# Patient Record
Sex: Female | Born: 1935 | Race: White | Hispanic: No | Smoking: Never smoker
Health system: Southern US, Community
[De-identification: ages and names within clinical notes are randomized; demographics above are authoritative.]

## PROBLEM LIST (undated history)

## (undated) DIAGNOSIS — C801 Malignant (primary) neoplasm, unspecified: Secondary | ICD-10-CM

## (undated) DIAGNOSIS — D649 Anemia, unspecified: Secondary | ICD-10-CM

## (undated) DIAGNOSIS — R51 Headache: Secondary | ICD-10-CM

## (undated) DIAGNOSIS — N189 Chronic kidney disease, unspecified: Secondary | ICD-10-CM

## (undated) DIAGNOSIS — M109 Gout, unspecified: Secondary | ICD-10-CM

## (undated) DIAGNOSIS — N841 Polyp of cervix uteri: Secondary | ICD-10-CM

## (undated) DIAGNOSIS — I872 Venous insufficiency (chronic) (peripheral): Secondary | ICD-10-CM

## (undated) DIAGNOSIS — Z8489 Family history of other specified conditions: Secondary | ICD-10-CM

## (undated) DIAGNOSIS — R42 Dizziness and giddiness: Secondary | ICD-10-CM

## (undated) DIAGNOSIS — I10 Essential (primary) hypertension: Secondary | ICD-10-CM

## (undated) DIAGNOSIS — N950 Postmenopausal bleeding: Secondary | ICD-10-CM

## (undated) DIAGNOSIS — M1990 Unspecified osteoarthritis, unspecified site: Secondary | ICD-10-CM

## (undated) DIAGNOSIS — R519 Headache, unspecified: Secondary | ICD-10-CM

## (undated) DIAGNOSIS — E7800 Pure hypercholesterolemia, unspecified: Secondary | ICD-10-CM

## (undated) HISTORY — PX: MOUTH SURGERY: SHX715

## (undated) HISTORY — PX: DILATION AND CURETTAGE OF UTERUS: SHX78

## (undated) HISTORY — PX: TUBAL LIGATION: SHX77

## (undated) HISTORY — PX: CHOLECYSTECTOMY: SHX55

## (undated) HISTORY — PX: OTHER SURGICAL HISTORY: SHX169

## (undated) HISTORY — PX: APPENDECTOMY: SHX54

## (undated) HISTORY — PX: JOINT REPLACEMENT: SHX530

---

## 2004-03-30 ENCOUNTER — Ambulatory Visit: Payer: Self-pay | Admitting: Gastroenterology

## 2005-01-25 ENCOUNTER — Ambulatory Visit: Payer: Self-pay | Admitting: Internal Medicine

## 2006-01-31 ENCOUNTER — Ambulatory Visit: Payer: Self-pay | Admitting: Internal Medicine

## 2007-02-22 ENCOUNTER — Ambulatory Visit: Payer: Self-pay | Admitting: Internal Medicine

## 2007-03-30 ENCOUNTER — Ambulatory Visit: Payer: Self-pay | Admitting: Internal Medicine

## 2007-04-11 ENCOUNTER — Ambulatory Visit: Payer: Self-pay | Admitting: Gastroenterology

## 2008-03-05 ENCOUNTER — Ambulatory Visit: Payer: Self-pay | Admitting: Internal Medicine

## 2009-04-21 ENCOUNTER — Ambulatory Visit: Payer: Self-pay | Admitting: Internal Medicine

## 2010-04-30 ENCOUNTER — Ambulatory Visit: Payer: Self-pay | Admitting: Internal Medicine

## 2010-05-11 ENCOUNTER — Ambulatory Visit: Payer: Self-pay | Admitting: Internal Medicine

## 2010-06-02 ENCOUNTER — Ambulatory Visit: Payer: Self-pay | Admitting: Surgery

## 2010-06-02 HISTORY — PX: BREAST BIOPSY: SHX20

## 2010-06-07 LAB — PATHOLOGY REPORT

## 2011-02-17 ENCOUNTER — Ambulatory Visit: Payer: Self-pay | Admitting: Gastroenterology

## 2011-02-21 LAB — PATHOLOGY REPORT

## 2011-04-07 ENCOUNTER — Ambulatory Visit: Payer: Self-pay | Admitting: Internal Medicine

## 2011-06-07 ENCOUNTER — Ambulatory Visit: Payer: Self-pay | Admitting: Internal Medicine

## 2011-09-18 ENCOUNTER — Observation Stay: Payer: Self-pay | Admitting: Internal Medicine

## 2011-09-20 LAB — CBC WITH DIFFERENTIAL/PLATELET
Basophil #: 0 10*3/uL (ref 0.0–0.1)
Eosinophil #: 0 10*3/uL (ref 0.0–0.7)
Eosinophil %: 0 %
HCT: 35.6 % (ref 35.0–47.0)
Lymphocyte %: 14.5 %
MCHC: 32.9 g/dL (ref 32.0–36.0)
MCV: 88 fL (ref 80–100)
Monocyte #: 0.5 x10 3/mm (ref 0.2–0.9)
Neutrophil #: 9 10*3/uL — ABNORMAL HIGH (ref 1.4–6.5)
Platelet: 278 10*3/uL (ref 150–440)
RBC: 4.04 10*6/uL (ref 3.80–5.20)
WBC: 11.2 10*3/uL — ABNORMAL HIGH (ref 3.6–11.0)

## 2011-09-20 LAB — COMPREHENSIVE METABOLIC PANEL
Albumin: 3.7 g/dL (ref 3.4–5.0)
Alkaline Phosphatase: 68 U/L (ref 50–136)
Anion Gap: 9 (ref 7–16)
Chloride: 104 mmol/L (ref 98–107)
Co2: 25 mmol/L (ref 21–32)
Creatinine: 1.68 mg/dL — ABNORMAL HIGH (ref 0.60–1.30)
EGFR (African American): 34 — ABNORMAL LOW
Glucose: 126 mg/dL — ABNORMAL HIGH (ref 65–99)
Osmolality: 288 (ref 275–301)
Potassium: 4.4 mmol/L (ref 3.5–5.1)
SGOT(AST): 61 U/L — ABNORMAL HIGH (ref 15–37)
Sodium: 138 mmol/L (ref 136–145)
Total Protein: 7 g/dL (ref 6.4–8.2)

## 2012-01-20 DIAGNOSIS — M170 Bilateral primary osteoarthritis of knee: Secondary | ICD-10-CM | POA: Insufficient documentation

## 2012-05-15 ENCOUNTER — Ambulatory Visit: Payer: Self-pay | Admitting: Orthopedic Surgery

## 2012-06-11 ENCOUNTER — Ambulatory Visit: Payer: Self-pay | Admitting: Orthopedic Surgery

## 2012-06-11 LAB — BASIC METABOLIC PANEL
Calcium, Total: 10 mg/dL (ref 8.5–10.1)
Co2: 29 mmol/L (ref 21–32)
Creatinine: 1.14 mg/dL (ref 0.60–1.30)
EGFR (African American): 54 — ABNORMAL LOW
EGFR (Non-African Amer.): 47 — ABNORMAL LOW
Osmolality: 281 (ref 275–301)

## 2012-06-11 LAB — CBC
HGB: 12.4 g/dL (ref 12.0–16.0)
MCHC: 32.8 g/dL (ref 32.0–36.0)
MCV: 89 fL (ref 80–100)
Platelet: 267 10*3/uL (ref 150–440)
RBC: 4.26 10*6/uL (ref 3.80–5.20)
RDW: 14.3 % (ref 11.5–14.5)

## 2012-06-11 LAB — SEDIMENTATION RATE: Erythrocyte Sed Rate: 34 mm/hr — ABNORMAL HIGH (ref 0–30)

## 2012-06-11 LAB — MRSA PCR SCREENING

## 2012-06-14 ENCOUNTER — Ambulatory Visit: Payer: Self-pay | Admitting: Anesthesiology

## 2012-06-19 ENCOUNTER — Ambulatory Visit: Payer: Self-pay | Admitting: Internal Medicine

## 2012-06-21 ENCOUNTER — Inpatient Hospital Stay: Payer: Self-pay | Admitting: Orthopedic Surgery

## 2012-06-22 ENCOUNTER — Encounter: Payer: Self-pay | Admitting: Internal Medicine

## 2012-06-22 LAB — BASIC METABOLIC PANEL
Anion Gap: 5 — ABNORMAL LOW (ref 7–16)
Calcium, Total: 8.7 mg/dL (ref 8.5–10.1)
Chloride: 107 mmol/L (ref 98–107)
Co2: 27 mmol/L (ref 21–32)
EGFR (African American): >60
EGFR (Non-African Amer.): 55 — ABNORMAL LOW
Glucose: 90 mg/dL (ref 65–99)
Osmolality: 277 (ref 275–301)
Sodium: 139 mmol/L (ref 136–145)

## 2012-06-22 LAB — HEMOGLOBIN: HGB: 9.9 g/dL — ABNORMAL LOW (ref 12.0–16.0)

## 2012-06-22 LAB — PLATELET COUNT: Platelet: 227 10*3/uL (ref 150–440)

## 2012-06-23 LAB — HEMOGLOBIN: HGB: 10.1 g/dL — ABNORMAL LOW (ref 12.0–16.0)

## 2012-06-30 ENCOUNTER — Encounter: Payer: Self-pay | Admitting: Internal Medicine

## 2013-05-21 DIAGNOSIS — M25562 Pain in left knee: Secondary | ICD-10-CM | POA: Insufficient documentation

## 2013-05-21 DIAGNOSIS — M1712 Unilateral primary osteoarthritis, left knee: Secondary | ICD-10-CM | POA: Insufficient documentation

## 2013-06-20 ENCOUNTER — Ambulatory Visit: Payer: Self-pay | Admitting: Internal Medicine

## 2014-01-28 ENCOUNTER — Ambulatory Visit: Payer: Self-pay | Admitting: Orthopedic Surgery

## 2014-03-13 ENCOUNTER — Ambulatory Visit: Payer: Self-pay | Admitting: Orthopedic Surgery

## 2014-03-13 LAB — BASIC METABOLIC PANEL
Anion Gap: 8 (ref 7–16)
BUN: 28 mg/dL — ABNORMAL HIGH (ref 7–18)
CALCIUM: 9.2 mg/dL (ref 8.5–10.1)
CO2: 28 mmol/L (ref 21–32)
CREATININE: 1.37 mg/dL — AB (ref 0.60–1.30)
Chloride: 104 mmol/L (ref 98–107)
EGFR (African American): 48 — ABNORMAL LOW
GFR CALC NON AF AMER: 40 — AB
Glucose: 111 mg/dL — ABNORMAL HIGH (ref 65–99)
Osmolality: 286 (ref 275–301)
Potassium: 3.8 mmol/L (ref 3.5–5.1)
Sodium: 140 mmol/L (ref 136–145)

## 2014-03-13 LAB — CBC
HCT: 36 % (ref 35.0–47.0)
HGB: 11.5 g/dL — AB (ref 12.0–16.0)
MCH: 29.5 pg (ref 26.0–34.0)
MCHC: 32.1 g/dL (ref 32.0–36.0)
MCV: 92 fL (ref 80–100)
Platelet: 240 10*3/uL (ref 150–440)
RBC: 3.92 10*6/uL (ref 3.80–5.20)
RDW: 14.4 % (ref 11.5–14.5)
WBC: 6.7 10*3/uL (ref 3.6–11.0)

## 2014-03-13 LAB — SEDIMENTATION RATE: ERYTHROCYTE SED RATE: 34 mm/h — AB (ref 0–30)

## 2014-03-13 LAB — URINALYSIS, COMPLETE
BILIRUBIN, UR: NEGATIVE
BLOOD: NEGATIVE
Bacteria: NONE SEEN
Glucose,UR: NEGATIVE mg/dL (ref 0–75)
KETONE: NEGATIVE
Leukocyte Esterase: NEGATIVE
Nitrite: NEGATIVE
PROTEIN: NEGATIVE
Ph: 6 (ref 4.5–8.0)
SPECIFIC GRAVITY: 1.014 (ref 1.003–1.030)
Squamous Epithelial: 1
WBC UR: <1 /HPF (ref 0–5)

## 2014-03-13 LAB — PROTIME-INR
INR: 0.9
Prothrombin Time: 12.3 secs (ref 11.5–14.7)

## 2014-03-13 LAB — APTT: Activated PTT: 29.2 secs (ref 23.6–35.9)

## 2014-03-13 LAB — MRSA PCR SCREENING

## 2014-03-25 ENCOUNTER — Inpatient Hospital Stay: Payer: Self-pay

## 2014-03-26 LAB — BASIC METABOLIC PANEL
Anion Gap: 6 — ABNORMAL LOW (ref 7–16)
BUN: 18 mg/dL (ref 7–18)
Calcium, Total: 8.3 mg/dL — ABNORMAL LOW (ref 8.5–10.1)
Chloride: 103 mmol/L (ref 98–107)
Co2: 30 mmol/L (ref 21–32)
Creatinine: 1.21 mg/dL (ref 0.60–1.30)
EGFR (African American): 56 — ABNORMAL LOW
EGFR (Non-African Amer.): 46 — ABNORMAL LOW
GLUCOSE: 130 mg/dL — AB (ref 65–99)
OSMOLALITY: 281 (ref 275–301)
Potassium: 3.6 mmol/L (ref 3.5–5.1)
Sodium: 139 mmol/L (ref 136–145)

## 2014-03-26 LAB — PLATELET COUNT: Platelet: 157 10*3/uL (ref 150–440)

## 2014-03-26 LAB — HEMOGLOBIN: HGB: 9.8 g/dL — ABNORMAL LOW (ref 12.0–16.0)

## 2014-03-27 LAB — CBC
HCT: 30 % — AB (ref 35.0–47.0)
HGB: 9.8 g/dL — ABNORMAL LOW (ref 12.0–16.0)
MCH: 30.3 pg (ref 26.0–34.0)
MCHC: 32.5 g/dL (ref 32.0–36.0)
MCV: 93 fL (ref 80–100)
Platelet: 152 10*3/uL (ref 150–440)
RBC: 3.22 10*6/uL — ABNORMAL LOW (ref 3.80–5.20)
RDW: 14.6 % — AB (ref 11.5–14.5)
WBC: 7.7 10*3/uL (ref 3.6–11.0)

## 2014-03-28 ENCOUNTER — Encounter: Payer: Self-pay | Admitting: Internal Medicine

## 2014-03-30 ENCOUNTER — Encounter: Payer: Self-pay | Admitting: Internal Medicine

## 2014-04-29 ENCOUNTER — Encounter: Payer: Self-pay | Admitting: Internal Medicine

## 2014-05-30 ENCOUNTER — Encounter: Payer: Self-pay | Admitting: Internal Medicine

## 2014-05-30 DIAGNOSIS — R42 Dizziness and giddiness: Secondary | ICD-10-CM

## 2014-05-30 HISTORY — DX: Dizziness and giddiness: R42

## 2014-07-10 ENCOUNTER — Ambulatory Visit: Payer: Self-pay | Admitting: Internal Medicine

## 2014-09-19 NOTE — Op Note (Signed)
PATIENT NAME:  Susan Brooks, Susan Brooks MR#:  [id number] DATE OF BIRTH:  1935/01/05  DATE OF PROCEDURE:  06/21/2012  PREOPERATIVE DIAGNOSIS: Severe right knee osteoarthritis.   POSTOPERATIVE DIAGNOSIS: Severe right knee osteoarthritis.   PROCEDURE: Right total knee replacement.   ANESTHESIA: Spinal.   SURGEON: Laurene Footman, M.D.  DESCRIPTION OF PROCEDURE: The patient was brought to the Operating Room and after adequate anesthesia was obtained, the right leg was prepped and draped in the usual sterile fashion with a tourniquet applied to the upper thigh An Alvarado legholder was utilized during the procedure. After patient identification and timeout procedures were completed, the leg was exsanguinated with an Esmarch and a midline skin incision was made followed by a medial parapatellar arthrotomy. Inspection revealed sclerotic bone throughout the knee, essentially no cartilage remaining, and extensive osteophyte formation. The ACL was absent and the fat pad was excised. Proximal tibia was exposed to allow for application of the tibial cutting block guide for the proximal resection. After checking alignment and getting this in the appropriate position, proximal tibia cut was carried out. Next, the femur was approached in a similar fashion, with distal femoral resection cut made with cuts matching the femoral block. The 4 cutting block was then applied. Anterior, posterior and chamfer cuts were carried out without notching. The residual horns of the meniscus were excised, along with a posterior osteophyte and loose body off the posterior capsule. At this point, trial components were placed, with the rotation on the tibial side being based on a pin left from the insertion of the initial cutting block. Proximal drilling was carried out, followed by the notch cut and trialing with a 4 femur in place. The 12 mm insert gave excellent stability in flexion, extension and mid flexion, with full extension being obtained.  At this point, the patella was cut with freehand technique, since it had been so worn, sized to a size 2 and drill holes made. There was excellent patellofemoral tracking with no-touch technique. At this point, all components were removed and the bony surfaces thoroughly irrigated and dried. A combination of morphine, Sensorcaine and Toradol along with saline was infiltrated in the posterior capsule as well as along the medial arthrotomy for postoperative analgesia. The tibial component was cemented into place first with excess cement removed, followed by the tibial insert. The femoral component was placed and the knee held in extension as the patellar button was clamped into place. After the cement had set, excess bone cement was removed and the knee again thoroughly irrigated. The tourniquet was let down to check hemostasis. The arthrotomy was repaired using a heavy quill suture followed by 2-0 quill subcutaneously, skin staples, Xeroform, 4 x 4's, Webril, ABD, Ace wrap and Polar Care.   ESTIMATED BLOOD LOSS: Less than 50 mL.   COMPLICATIONS: None.   SPECIMEN: Cut ends of bone.   IMPLANTS: Medacta GMK primary total knee system, size 2 patella, size 3 right fixed tibial plate; a 3, 12 mm UC tibial insert with a 4 narrow right standard femur, all components cemented.    ____________________________ Laurene Footman, MD mjm:jm D: 06/21/2012 18:41:43 ET T: 06/21/2012 21:40:58 ET JOB#: [id number]  cc: Laurene Footman, MD, <Dictator> Laurene Footman MD ELECTRONICALLY SIGNED 06/22/2012 10:46

## 2014-09-20 NOTE — Op Note (Signed)
PATIENT NAME:  Susan Brooks, Susan Brooks MR#:  [id number] DATE OF BIRTH:  1935-04-23  DATE OF PROCEDURE:  03/25/2014  PREOPERATIVE DIAGNOSIS: Severe left knee osteoarthritis.   POSTOPERATIVE DIAGNOSIS: Severe left knee osteoarthritis.   PROCEDURE: Left total knee replacement.   ANESTHESIA: Spinal.   SURGEON: Hessie Knows, M.D.   ASSISTANT: Rachelle Hora, PA-C.   DESCRIPTION OF PROCEDURE: The patient was brought to the operating room and after adequate anesthesia was obtained, the left leg was prepped and draped in the usual sterile fashion with a bump underneath the left buttock to internally rotate the leg. A tourniquet applied to the upper thigh. After prepping and draping usual sterile fashion, appropriate patient identification and timeout procedures were completed, the tourniquet was raised. A midline skin incision was made, followed by a medial parapatellar arthrotomy. Inspection revealed eburnated bone in the medial compartment and patellofemoral joint was significant bone loss in each, particularly of the patella and femoral condyle. The lateral compartment had exposed bone, but without loss of bone. The ACL and PCL were excised along with the infrapatellar fat pad and anterior horns of the menisci. The proximal tibia was exposed to allow for application of the Medacta cutting block. This was applied and the proximal tibia cut carried out after the femoral cut and then carried out as well with redone with an additional millimeters resected to get the appropriate  extension gap Attention was turned to the femur the cutting block applied after removing the small amount of residual cartilage and the distal cut carried out with cuts matching they preop template. A 3 cutting guide was applied, anterior, posterior and chamfer cuts made. At this point, the residual horns of the posterior horns of the menisci were excised and a curved osteotome used to remove posterior osteophytes on the femur. The tibial tray  was placed in the appropriate orientation and proximal drill hole made and keel punch followed by placement of a 10 mm insert, trial. The femoral component trial was impacted into place and had good stability with normal patellofemoral tracking. Distal femoral drill holes were then made and all trials were removed. The notch cut for the femur was then carried out. Attention was turned to the patella with patellar cutting guide applied. The patella was quite thin with loss of bone and so less than 10 mm resected, appropriate 20 mm and 7 mm resected to 13 mm, Drill holes were made and it sized to a size 2 which fit well. At this point, all trials having been removed and the wound thoroughly irrigated. Tourniquet was let down and hemostasis checked with electrocautery. The knee was infiltrated with a combination of Exparel diluted with saline in multiple locations along with a dilute mixture of morphine, Toradol and 0.25% Sensorcaine with epinephrine. This was also infiltrated in the periarticular tissues. The tourniquet was raised again and the bony surfaces thoroughly irrigated and dried. All components were cemented into place with a 10 mm insert placed after the cement had set, the small amount of residual cement around the edges were removed. The wound was again thoroughly irrigated and tourniquet let down arthrotomy was repaired using a heavy Quill suture, 2 - 0 Quill subcutaneously and skin staples. Xeroform, 4 x 4's, ABDs, Webril and Ace wrap along with Polar Care applied and knee immobilizer. The patient was then sent to the recovery room in stable condition.   ESTIMATED BLOOD LOSS: 150.   TOURNIQUET TIME: 69 minutes at 300 mmHg.   COMPLICATIONS: None implants:  IMPLANTS: Medacta GMK sphere system with a left 3 femur, a 3 left fixed tibia component with a 10 mm   insert,  2 cemented patella. All components cemented.    ____________________________ Laurene Footman, MD mjm:JT D: 03/25/2014  18:27:47 ET T: 03/26/2014 05:29:40 ET JOB#: [id number]  cc: Laurene Footman, MD, <Dictator> Laurene Footman MD ELECTRONICALLY SIGNED 03/26/2014 8:29

## 2014-09-20 NOTE — Discharge Summary (Signed)
PATIENT NAME:  Susan Brooks, Susan Brooks MR#:  [id number] DATE OF BIRTH:  1934-12-14  DATE OF ADMISSION:  03/25/2014 DATE OF DISCHARGE:  03/28/2014  ADMITTING DIAGNOSIS:  Degenerative arthritis, left knee.   DISCHARGE DIAGNOSIS:  Total knee arthroplasty, left.   SURGEON: Laurene Footman, MD.   ASSISTANT: Rachelle Hora, PA-C.   ANESTHESIA: Spinal.   ESTIMATED BLOOD LOSS: 150 mL.   TOURNIQUET TIME: 69 minutes.   SPECIMEN: Cut ends of bone.   IMPLANTS: Medacta 3 femur, tibia 3, 10-mm 2 patella, GMK sphere.  HISTORY:  The patient came in to the clinic for H and P for left total knee arthroplasty. She has worsening symptoms and limited activities of daily living. The patient's knee pain is severe. She has reviewed joint educational information provided at a previous visit. All conservative measures have failed including medications, physical therapy, injection therapy and bracing.   PHYSICAL EXAMINATION:  HEENT: Normal.  HEART: Regular rhythm.  LUNGS: Clear to auscultation.  LEFT LOWER EXTREMITY:  Examination of the left lower extremity shows the patient ambulates with slight limp.  She has moderate swelling. She is nontender to palpation. She has pain with range of motion. Range of motion is 20-90 degrees. She has good strength with quadriceps and hamstring strength testing. She is neurovascularly intact in the left lower extremity.   HOSPITAL COURSE: The patient was admitted to the hospital on 03/25/2014. She had surgery that same day and was brought to the orthopedic floor from the PACU in stable condition. On postoperative day 1, the patient had acute postoperative blood loss anemia with hemoglobin of 9.8. On postoperative day 2, hemoglobin was stable at 9.8. The patient's pain has been well controlled. We discontinued oxycodone due to the nausea. She has been on tramadol only and tolerating this well in addition to Tylenol. The patient has made good progress with physical therapy. By postoperative  day 3, the patient was stable and ready for discharge to a rehabilitation facility with physical therapy and occupational therapy.   CONDITION AT DISCHARGE: Stable.   DISCHARGE INSTRUCTIONS: The patient can increase weight-bearing on the affected extremity. She is to elevate the affected foot or leg on 1-2 pillows with the foot higher than the knee. Thigh-high TED hose on both legs, remove 1 hour per 8-hour shift. Elevate the heels off bed. Use incentive spirometer every hour while awake. Encourage cough and deep breathing. The patient may resume a regular diet as tolerated. Continue using Polar Care unit maintaining temperature between 40-50 degrees. Do not get the dressing or bandage wet or dirty. Call [place] [facility] if the dressing gets water under it. Leave the dressing on. Call [facility] if any of the following occur: Bright red bleeding over the incision wound, fever over 101.5 degrees, redness, swelling, or drainage at the incision site. Call [facility] if you experience any increased leg pain, numbness or weakness in legs or bowel or bladder symptoms. The patient has been referred to [facility] for physical therapy and occupational therapy.  Follow in 2 weeks with [facility].    DISCHARGE MEDICATIONS: Please see list of discharge medications on discharge instructions.    ____________________________ T. Rachelle Hora, PA-C tcg:lt D: 03/28/2014 07:49:25 ET T: 03/28/2014 08:23:18 ET JOB#: [id number]  cc: T. Rachelle Hora, PA-C, <Dictator> Duanne Guess [place] ELECTRONICALLY SIGNED 04/15/2014 12:42

## 2014-09-20 NOTE — Op Note (Signed)
PATIENT NAME:  Susan Brooks, Susan Brooks MR#:  [id number] DATE OF BIRTH:  [birth year]  DATE OF PROCEDURE:  03/25/2014  PREOPERATIVE DIAGNOSIS: Severe left knee osteoarthritis.   POSTOPERATIVE DIAGNOSIS: Severe left knee osteoarthritis.   PROCEDURE: Left total knee replacement.   ANESTHESIA: Spinal.   SURGEON: Hessie Knows, M.D.   ASSISTANT: Rachelle Hora, PA-C.   DESCRIPTION OF PROCEDURE: The patient was brought to the operating room and after adequate anesthesia was obtained, the left leg was prepped and draped in the usual sterile fashion with a bump underneath the left buttock to internally rotate the leg. A tourniquet applied to the upper thigh. After prepping and draping usual sterile fashion, appropriate patient identification and timeout procedures were completed, the tourniquet was raised. A midline skin incision was made, followed by a medial parapatellar arthrotomy. Inspection revealed eburnated bone in the medial compartment and patellofemoral joint was significant bone loss in each, particularly of the patella and femoral condyle. The lateral compartment had exposed bone, but without loss of bone. The ACL and PCL were excised along with the infrapatellar fat pad and anterior horns of the menisci. The proximal tibia was exposed to allow for application of the Medacta cutting block. This was applied and the proximal tibia cut carried out after the femoral cut and then carried out as well with redone with an additional millimeters resected to get the appropriate  extension gap Attention was turned to the femur the cutting block applied after removing the small amount of residual cartilage and the distal cut carried out with cuts matching they preop template. A 3 cutting guide was applied, anterior, posterior and chamfer cuts made. At this point, the residual horns of the posterior horns of the menisci were excised and a curved osteotome used to remove posterior osteophytes on the femur. The tibial tray  was placed in the appropriate orientation and proximal drill hole made and keel punch followed by placement of a 10 mm insert, trial. The femoral component trial was impacted into place and had good stability with normal patellofemoral tracking. Distal femoral drill holes were then made and all trials were removed. The notch cut for the femur was then carried out. Attention was turned to the patella with patellar cutting guide applied. The patella was quite thin with loss of bone and so less than 10 mm resected, appropriate 20 mm and 7 mm resected to 13 mm, Drill holes were made and it sized to a size 2 which fit well. At this point, all trials having been removed and the wound thoroughly irrigated. Tourniquet was let down and hemostasis checked with electrocautery. The knee was infiltrated with a combination of Exparel diluted with saline in multiple locations along with a dilute mixture of morphine, Toradol and 0.25% Sensorcaine with epinephrine. This was also infiltrated in the periarticular tissues. The tourniquet was raised again and the bony surfaces thoroughly irrigated and dried. All components were cemented into place with a 10 mm insert placed after the cement had set, the small amount of residual cement around the edges were removed. The wound was again thoroughly irrigated and tourniquet let down arthrotomy was repaired using a heavy Quill suture, 2 - 0 Quill subcutaneously and skin staples. Xeroform, 4 x 4's, ABDs, Webril and Ace wrap along with Polar Care applied and knee immobilizer. The patient was then sent to the recovery room in stable condition.   ESTIMATED BLOOD LOSS: 150.   TOURNIQUET TIME: 69 minutes at 300 mmHg.   COMPLICATIONS: None implants:  IMPLANTS: Medacta GMK sphere system with a left 3 femur, a 3 left fixed tibia component with a 10 mm   insert,  2 cemented patella. All components cemented.   ____________________________ Laurene Footman, MD mjm:JT D: 03/25/2014 18:27:00  ET T: 03/26/2014 05:29:40 ET JOB#: [id number]  cc: Laurene Footman, MD, <Dictator> Laurene Footman MD ELECTRONICALLY SIGNED 04/05/2014 0:17

## 2014-09-21 NOTE — H&P (Signed)
PATIENT NAME:  Susan Brooks, Susan Brooks MR#:  [id number] DATE OF BIRTH:  25-Aug-1934  DATE OF ADMISSION:  09/18/2011  PRIMARY CARE PHYSICIAN: Dr. Fulton Reek   CHIEF COMPLAINT: Generalized itching, urticaria, hives, lips and tongue swelling.   HISTORY OF PRESENT ILLNESS: Ms. Kovack is a 80 year old pleasant Caucasian female with past history of systemic hypertension and hyperlipidemia. She was in her usual state of health until 24 hours ago, that is yesterday, after going to a restaurant. She ate chicken and vegetables and [place] dressing, after which she went to the movies.  There she started to have itching that was generalized, and when she went home she had hives all over. She took Benadryl and went to sleep. When she woke up in the morning her urticaria was worse. This was also associated with tongue swelling and lip swelling. The patient decided to come to the Emergency Department and she has been here for several hours during which time she received steroid doses along with several doses of Benadryl and ranitidine. However, there was some improvement and then the urticaria came back again.  The patient was admitted for observation and continued treatment of her allergic reaction. The patient denies having any shortness of breath, stridor, or difficulty in swallowing.     REVIEW OF SYSTEMS: CONSTITUTIONAL: No fever. No chills. No night sweats. No fatigue. EYES: No blurring of vision. No double vision. ENT: No hearing impairment. No sore throat. No dysphagia but reports the lip and tongue swelling. CARDIOVASCULAR: No chest pain. No shortness of breath. No peripheral edema. No syncope. RESPIRATORY: No cough. No shortness of breath. No chest pain. GASTROINTESTINAL: No abdominal pain. No vomiting. No diarrhea. GENITOURINARY: No dysuria. No frequency of urination. MUSCULOSKELETAL: No joint swelling or pain. No muscular pain or swelling. INTEGUMENT: She has skin rash in the form of hives. No ulcers. NEUROLOGIC: No  focal weakness. No seizure activity. No headache. PSYCHIATRY: No anxiety. No depression.  ENDOCRINE: No polyuria or polydipsia. No heat or cold intolerance.   PAST MEDICAL HISTORY:  1. History of systemic hypertension.  2. History of hyperlipidemia.  3. History of gout.   PAST SURGICAL HISTORY:  1. History of bilateral hip replacements.  2. History of cholecystectomy.   SOCIAL HABITS: Nonsmoker. No history of alcohol or drug abuse. She drinks occasionally a glass of wine.   FAMILY HISTORY: Her parents suffered from hypertension and osteoarthritis.   SOCIAL HISTORY: She is widowed. Lives at home alone. She still works as Scientist, physiological at [employer]:  1. TriCor 48 mg once a day.  2. Propranolol 40 mg twice a day.  3. Lotensin once a day, dose was not specified.  4. Allopurinol 100 mg once a day.   ALLERGIES: No known drug allergies but she reports sulfa causing nausea.   PHYSICAL EXAMINATION:  VITAL SIGNS: Blood pressure 137/62, respiratory rate 18, pulse 63, temperature 98.3, pulse oximetry is 97% on room air.   GENERAL APPEARANCE: Elderly pleasant lady lying in bed in no acute distress.   HEAD: No pallor. No icterus. No cyanosis.   ENT: Hearing was normal. Nasal mucosa, lips, and tongue were normal at the time of my examination. I do not see any swelling of her tongue. Uvula at midline. Normal speech and swallowing.   EYES: Normal iris and conjunctivae. Pupils about 4 to 5 mm, equal and reactive to light.   NECK: Supple. Trachea at midline. No thyromegaly. No cervical lymphadenopathy. No masses.   HEART:  Normal S1, S2. No S3, S4. No murmur. No gallop. No carotid bruits.   RESPIRATORY: Normal breathing pattern without use of accessory muscles. No rales. No wheezing. Good air entry.   ABDOMEN: Soft without tenderness. No hepatosplenomegaly. No masses. No hernias.   MUSCULOSKELETAL: No joint swelling. No clubbing.   SKIN: Skin rash  with localized swelling, circular pattern involving the four extremities, abdomen, upper chest, and the back of her shoulders.   NEUROLOGIC: Cranial nerves II through XII were intact. No focal motor deficit.   PSYCHIATRIC: The patient is alert and oriented times three. Mood and affect were normal.   LABORATORY, DIAGNOSTIC, AND RADIOLOGICAL DATA: No labs were done.   IMPRESSION:  1. Angioedema characterized by swelling of the lips and tongue. This had eased off. It seems to have recurred again and now it is improving.  2. Urticaria with swelling of skin lesions over her trunk and extremities times 24 hours, likely related to one of her food items, maybe the [place] dressing. I cannot rule out the possibility of drug interaction, especially the ACE inhibitor or the allopurinol.  3. History of systemic hypertension.  4. History of hyperlipidemia.  5. History of gout.  6. History of bilateral hip replacements.  7. History of cholecystectomy.   PLAN: Admit the patient for observation. Benadryl 25 mg every six hours p.r.n. Ranitidine 150 mg twice a day. IV Solu-Medrol 60 mg every six hours. I will continue her home medications of propranolol and TriCor but I will hold Lotensin and allopurinol. I spoke with patient regarding LIVING WILL and she confirms that she has a LIVING WILL but she did not appoint a guardian.        TIME SPENT EVALUATING THIS PATIENT: More than 45 minutes.      ____________________________ Clovis Pu. Lenore Manner, MD amd:bjt D: 09/18/2011 22:48:39 ET T: 09/19/2011 07:32:12 ET JOB#: [id number]  cc: Clovis Pu. Lenore Manner, MD, <Dictator> Leonie Douglas. Doy Hutching, MD Ellin Saba MD ELECTRONICALLY SIGNED 09/20/2011 22:14

## 2014-09-22 LAB — SURGICAL PATHOLOGY

## 2015-08-25 ENCOUNTER — Other Ambulatory Visit: Payer: Self-pay | Admitting: Internal Medicine

## 2015-08-25 DIAGNOSIS — Z1231 Encounter for screening mammogram for malignant neoplasm of breast: Secondary | ICD-10-CM

## 2015-09-08 ENCOUNTER — Other Ambulatory Visit: Payer: Self-pay | Admitting: Internal Medicine

## 2015-09-08 ENCOUNTER — Ambulatory Visit
Admission: RE | Admit: 2015-09-08 | Discharge: 2015-09-08 | Disposition: A | Discharge status: Home / Self Care | Payer: Medicare Other | Source: Ambulatory Visit | Attending: Internal Medicine | Admitting: Internal Medicine

## 2015-09-08 DIAGNOSIS — Z1231 Encounter for screening mammogram for malignant neoplasm of breast: Secondary | ICD-10-CM | POA: Insufficient documentation

## 2015-10-30 DIAGNOSIS — Z01810 Encounter for preprocedural cardiovascular examination: Secondary | ICD-10-CM

## 2015-10-30 NOTE — H&P (Signed)
GYN RETURN PATIENT VISIT  Subjective:    Susan Brooks is a 81 y.o. female who presents for PMB f/u. Her endometrial biopsy returned as atrophy, but her TVUS today showed a 23mm EMS which is inconsistent with atrophy. Patient still have spotting.   Hx of PMB She was in her usual state of health until mid May when she started having vaginal spotting. 1 week ago the bleeding was very heavy and she came to the office to be evaluated. She has not noticed pelvic pain or vaginal discharge. No changes in bowel or bladder habits. +bloating lately. No early satiety.    Gynecologic History No LMP recorded. Patient is postmenopausal. - 81yo  No h/o abnl paps Last mammogram: 08/2015. Results were: normal   Menarche: 14 No issues Monthly  Obstetric History                      OB History  Gravida Para Term Preterm AB SAB TAB Ectopic Multiple Living  3 3 3       3     # Outcome Date GA Lbr Len/2nd Weight Sex Delivery Anes PTL Lv  3 Term           2 Term           1 Term             NSVD x 3   Past Medical History:  has a past medical history of Abnormal uterine bleeding, unspecified; Anemia, unspecified; Cervical polyp; Chronic headaches; Chronic venous insufficiency; Gout; Hemorrhoids; History of chickenpox; History of colon polyps; Hyperlipidemia, unspecified; Hypertension; Osteoarthritis; and Renal insufficiency. Problem List: has Hypertension; Hyperlipidemia, unspecified; Osteoarthritis; Gout; Chronic headaches; Anemia, unspecified; Renal insufficiency; Chronic venous insufficiency; Cervical polyp; and PMB (postmenopausal bleeding) on her problem list. Past Surgical History:  has a past surgical history that includes Bilateral hip replacements; Tubal ligation (Bilateral); Appendectomy; Cholecystectomy; Knee arthroscopy (Left); Oral surgery; and Left total knee replacement (Left, 03/25/14). Family History: family history includes Arthritis in her  father and mother; Gout in her father; Heart attack in her brother; Heart disease in her father and mother; Hypertension in her mother; Mental illness in her sister; Stroke in her mother. Social History:  reports that she has never smoked. She has never used smokeless tobacco. She reports that she drinks alcohol. She reports that she does not use illicit drugs. Current Medications: has a current medication list which includes the following prescription(s): acetaminophen, allopurinol, amoxicillin, aspirin, calcium carbonate-vitamin d3, fenofibrate nanocrystallized, fexofenadine-pseudoephedrine, meclizine, multivitamin, omega-3 fatty acids-vitamin e, propranolol, sennosides-docusate, tramadol, and vitamin e. Prior to encounter Medications:        Current Outpatient Prescriptions on File Prior to Visit  Medication Sig Dispense Refill  . acetaminophen (TYLENOL EXTRA STRENGTH) 500 MG tablet Take 1,000 mg by mouth as needed for Pain.    Marland Kitchen allopurinol (ZYLOPRIM) 300 MG tablet take 1 tablet by mouth once daily 90 tablet 1  . amoxicillin (AMOXIL) 500 MG capsule 4 caps by mouth one hour prior to dental appointment  0  . aspirin 81 MG EC tablet Take 81 mg by mouth once daily.    . calcium carbonate-vitamin D3 (CALTRATE 600+D) 600 mg(1,500mg ) -200 unit tablet Take 1 tablet by mouth once daily.    . fenofibrate nanocrystallized (TRICOR) 48 MG tablet take 1 tablet by mouth once daily 30 tablet 5  . fexofenadine-pseudoephedrine (ALLEGRA-D 24H) 180-240 mg ER tablet Take 1 tablet by mouth as needed.     Marland Kitchen  meclizine (ANTIVERT) 25 mg tablet take 1 tablet by mouth three times a day if needed for dizziness 60 tablet 5  . multivitamin (MULTIVITAMIN) tablet Take 1 tablet by mouth once daily.    Marland Kitchen omega-3 fatty acids-vitamin E (FISH OIL) 1,000 mg Take by mouth once daily.    . propranolol (INDERAL LA) 120 MG 24 hr capsule Take 1 capsule (120 mg total) by mouth 2 (two) times daily. 60 capsule 11  .  sennosides-docusate (SENOKOT-S) 8.6-50 mg tablet Take 2 tablets by mouth as needed.     . traMADol (ULTRAM) 50 mg tablet Take 1 tablet (50 mg total) by mouth every 4 (four) hours as needed for Pain. 30 tablet 0  . vitamin E 400 unit Tab Take by mouth once daily.     No current facility-administered medications on file prior to visit.    Allergies: is allergic to glutamine-c-quercet-selen-brom; prednisone; and sulfa (sulfonamide antibiotics).  Review of Systems 14 systems reviewed pertinent positives and negatives as noted in the HPI and below.   Objective:       Vitals:   10/29/15 1105  BP: 156/88  Pulse: 66   General appearance: alert, appears stated age and cooperative Lungs: clear to auscultation bilaterally Heart: regular rate and rhythm, S1, S2 normal, no murmur, click, rub or gallop  Assessment:   81yo G3P3 with PMB, biopsy c/w atrophy but not c/w TVUS findings. Given the discrepancy, will proceed to [facility] hysteroscopy. R/B/A reviewed with patient including bleeding, pain, infection, possible bowel/bladder injury, possible need for further surgeries. All questions answered, consents signed.  Plan:    1. PMB - D&C hysteroscopy scheduled for 11/13/15 - 2 week Postop visit - instructions given to patient - preop CMP, CBC, T&S, EKG - PCP Dr Doy Hutching in event of medical clearance   Joylene Igo, MD

## 2015-11-04 ENCOUNTER — Encounter
Admission: RE | Admit: 2015-11-04 | Discharge: 2015-11-04 | Disposition: A | Discharge status: Home / Self Care | Payer: Medicare Other | Source: Ambulatory Visit | Attending: Obstetrics and Gynecology | Admitting: Obstetrics and Gynecology

## 2015-11-04 DIAGNOSIS — Z01812 Encounter for preprocedural laboratory examination: Secondary | ICD-10-CM | POA: Diagnosis not present

## 2015-11-04 DIAGNOSIS — Z01810 Encounter for preprocedural cardiovascular examination: Secondary | ICD-10-CM | POA: Insufficient documentation

## 2015-11-04 HISTORY — DX: Dizziness and giddiness: R42

## 2015-11-04 HISTORY — DX: Essential (primary) hypertension: I10

## 2015-11-04 HISTORY — DX: Gout, unspecified: M10.9

## 2015-11-04 HISTORY — DX: Pure hypercholesterolemia, unspecified: E78.00

## 2015-11-04 HISTORY — DX: Unspecified osteoarthritis, unspecified site: M19.90

## 2015-11-04 HISTORY — DX: Family history of other specified conditions: Z84.89

## 2015-11-04 LAB — COMPREHENSIVE METABOLIC PANEL
ALBUMIN: 4.5 g/dL (ref 3.5–5.0)
ALK PHOS: 68 U/L (ref 38–126)
ALT: 51 U/L (ref 14–54)
AST: 59 U/L — AB (ref 15–41)
Anion gap: 9 (ref 5–15)
BUN: 25 mg/dL — ABNORMAL HIGH (ref 6–20)
CHLORIDE: 105 mmol/L (ref 101–111)
CO2: 26 mmol/L (ref 22–32)
CREATININE: 1.22 mg/dL — AB (ref 0.44–1.00)
Calcium: 10.2 mg/dL (ref 8.9–10.3)
GFR calc non Af Amer: 41 mL/min — ABNORMAL LOW (ref >60)
GFR, EST AFRICAN AMERICAN: 48 mL/min — AB (ref >60)
GLUCOSE: 105 mg/dL — AB (ref 65–99)
Potassium: 3.7 mmol/L (ref 3.5–5.1)
SODIUM: 140 mmol/L (ref 135–145)
Total Bilirubin: 0.6 mg/dL (ref 0.3–1.2)
Total Protein: 7.3 g/dL (ref 6.5–8.1)

## 2015-11-04 LAB — CBC
HCT: 37 % (ref 35.0–47.0)
HEMOGLOBIN: 12.5 g/dL (ref 12.0–16.0)
MCH: 30.4 pg (ref 26.0–34.0)
MCHC: 33.8 g/dL (ref 32.0–36.0)
MCV: 89.9 fL (ref 80.0–100.0)
PLATELETS: 217 10*3/uL (ref 150–440)
RBC: 4.12 MIL/uL (ref 3.80–5.20)
RDW: 14.7 % — ABNORMAL HIGH (ref 11.5–14.5)
WBC: 7 10*3/uL (ref 3.6–11.0)

## 2015-11-04 LAB — TYPE AND SCREEN
ABO/RH(D): O POS
ANTIBODY SCREEN: NEGATIVE

## 2015-11-04 NOTE — Pre-Procedure Instructions (Signed)
Pt will call tomorrow morning to discuss Lorsartan medication.

## 2015-11-04 NOTE — Pre-Procedure Instructions (Signed)
Reviewed met B results, compared to prior met B results not much change, pt does have a history of renal insufficiency.

## 2015-11-04 NOTE — Patient Instructions (Signed)
  Your procedure is scheduled on: Friday November 13, 2015. Report to Same Day Surgery. To find out your arrival time please call [phone] between 1PM - 3PM on Thursday November 12, 2015.  Remember: Instructions that are not followed completely may result in serious medical risk, up to and including death, or upon the discretion of your surgeon and anesthesiologist your surgery may need to be rescheduled.    _x___ 1. Do not eat food or drink liquids after midnight. No gum chewing or hard candies.     ____ 2. No Alcohol for 24 hours before or after surgery.   ____ 3. Bring all medications with you on the day of surgery if instructed.    __x__ 4. Notify your doctor if there is any change in your medical condition     (cold, fever, infections).     Do not wear jewelry, make-up, hairpins, clips or nail polish.  Do not wear lotions, powders, or perfumes. You may wear deodorant.  Do not shave 48 hours prior to surgery. Men may shave face and neck.  Do not bring valuables to the hospital.    [facility] is not responsible for any belongings or valuables.               Contacts, dentures or bridgework may not be worn into surgery.  Leave your suitcase in the car. After surgery it may be brought to your room.  For patients admitted to the hospital, discharge time is determined by your treatment team.   Patients discharged the day of surgery will not be allowed to drive home.    Please read over the following fact sheets that you were given:   [facility] Preparing for Surgery  _x___ Take these medicines the morning of surgery with A SIP OF WATER:    1. propranolol ER (INDERAL LA)    ____ Fleet Enema (as directed)   ____ Use CHG Soap as directed on instruction sheet  ____ Use inhalers on the day of surgery and bring to hospital day of surgery  ____ Stop metformin 2 days prior to surgery    ____ Take 1/2 of usual insulin dose the night before surgery and none on the morning of  surgery.    _x___ Pt has already stopping aspirin on 11/03/15.  _x__ Stop Anti-inflammatories such as Advil, Aleve, Ibuprofen, Motrin, Naproxen, Naprosyn, Goodies powders or aspirin products. Tylenol is OK to take.   _x___ Stop supplements:calcium citrate-vitamin D (CITRACAL+D),Multiple Vitamins-Minerals,   Omega 3-6-9 Fatty Acids (OMEGA-3 & OMEGA-6  FISH OIL PO) & vitamin E until after surgery.    ____ Bring C-Pap to the hospital.

## 2015-11-05 NOTE — Pre-Procedure Instructions (Signed)
Pt called this am with information on Losartan-HCTZ.

## 2015-11-13 ENCOUNTER — Encounter: Payer: Self-pay | Admitting: Anesthesiology

## 2015-11-13 ENCOUNTER — Ambulatory Visit: Payer: Medicare Other | Admitting: Anesthesiology

## 2015-11-13 ENCOUNTER — Ambulatory Visit
Admission: RE | Admit: 2015-11-13 | Discharge: 2015-11-13 | Disposition: A | Discharge status: Home / Self Care | Payer: Medicare Other | Source: Ambulatory Visit | Attending: Obstetrics and Gynecology | Admitting: Obstetrics and Gynecology

## 2015-11-13 ENCOUNTER — Encounter
Admission: RE | Disposition: A | Discharge status: Home / Self Care | Payer: Self-pay | Source: Ambulatory Visit | Attending: Obstetrics and Gynecology

## 2015-11-13 DIAGNOSIS — M1990 Unspecified osteoarthritis, unspecified site: Secondary | ICD-10-CM | POA: Diagnosis not present

## 2015-11-13 DIAGNOSIS — Z96643 Presence of artificial hip joint, bilateral: Secondary | ICD-10-CM | POA: Diagnosis not present

## 2015-11-13 DIAGNOSIS — N8501 Benign endometrial hyperplasia: Secondary | ICD-10-CM | POA: Insufficient documentation

## 2015-11-13 DIAGNOSIS — Z96652 Presence of left artificial knee joint: Secondary | ICD-10-CM | POA: Insufficient documentation

## 2015-11-13 DIAGNOSIS — N840 Polyp of corpus uteri: Secondary | ICD-10-CM | POA: Diagnosis not present

## 2015-11-13 DIAGNOSIS — Z79899 Other long term (current) drug therapy: Secondary | ICD-10-CM | POA: Insufficient documentation

## 2015-11-13 DIAGNOSIS — I10 Essential (primary) hypertension: Secondary | ICD-10-CM | POA: Diagnosis not present

## 2015-11-13 DIAGNOSIS — M109 Gout, unspecified: Secondary | ICD-10-CM | POA: Diagnosis not present

## 2015-11-13 DIAGNOSIS — Z7982 Long term (current) use of aspirin: Secondary | ICD-10-CM | POA: Diagnosis not present

## 2015-11-13 DIAGNOSIS — E785 Hyperlipidemia, unspecified: Secondary | ICD-10-CM | POA: Diagnosis not present

## 2015-11-13 DIAGNOSIS — N950 Postmenopausal bleeding: Secondary | ICD-10-CM | POA: Diagnosis present

## 2015-11-13 DIAGNOSIS — Z862 Personal history of diseases of the blood and blood-forming organs and certain disorders involving the immune mechanism: Secondary | ICD-10-CM | POA: Insufficient documentation

## 2015-11-13 DIAGNOSIS — Z86010 Personal history of colonic polyps: Secondary | ICD-10-CM | POA: Diagnosis not present

## 2015-11-13 HISTORY — PX: HYSTEROSCOPY WITH D & C: SHX1775

## 2015-11-13 SURGERY — DILATATION AND CURETTAGE /HYSTEROSCOPY
Anesthesia: General

## 2015-11-13 MED ORDER — LACTATED RINGERS IV SOLN
INTRAVENOUS | Status: DC
  Administered 2015-11-13: 12:00:00 via INTRAVENOUS

## 2015-11-13 MED ORDER — MIDAZOLAM HCL 2 MG/2ML IJ SOLN
INTRAMUSCULAR | Status: DC | PRN
  Administered 2015-11-13: 1 mg via INTRAVENOUS

## 2015-11-13 MED ORDER — LACTATED RINGERS IV SOLN: INTRAVENOUS | Status: DC

## 2015-11-13 MED ORDER — DEXAMETHASONE SODIUM PHOSPHATE 10 MG/ML IJ SOLN
INTRAMUSCULAR | Status: DC | PRN
  Administered 2015-11-13: 5 mg via INTRAVENOUS

## 2015-11-13 MED ORDER — DIPHENHYDRAMINE HCL 50 MG/ML IJ SOLN: 12.5000 mg | Freq: Four times a day (QID) | INTRAMUSCULAR | Status: DC | PRN

## 2015-11-13 MED ORDER — FAMOTIDINE 20 MG PO TABS
ORAL_TABLET | ORAL | Status: AC
  Administered 2015-11-13: 20 mg via ORAL
  Filled 2015-11-13: qty 1

## 2015-11-13 MED ORDER — KETOROLAC TROMETHAMINE 15 MG/ML IJ SOLN
15.0000 mg | Freq: Four times a day (QID) | INTRAMUSCULAR | Status: DC
  Filled 2015-11-13: qty 1

## 2015-11-13 MED ORDER — ACETAMINOPHEN 325 MG PO TABS: 650.0000 mg | ORAL_TABLET | Freq: Four times a day (QID) | ORAL | Status: DC | PRN

## 2015-11-13 MED ORDER — ACETAMINOPHEN 650 MG RE SUPP
650.0000 mg | Freq: Four times a day (QID) | RECTAL | Status: DC | PRN
  Filled 2015-11-13: qty 1

## 2015-11-13 MED ORDER — FENTANYL CITRATE (PF) 100 MCG/2ML IJ SOLN
INTRAMUSCULAR | Status: DC | PRN
  Administered 2015-11-13 (×2): 50 ug via INTRAVENOUS

## 2015-11-13 MED ORDER — DIPHENHYDRAMINE HCL 12.5 MG/5ML PO ELIX
12.5000 mg | ORAL_SOLUTION | Freq: Four times a day (QID) | ORAL | Status: DC | PRN
  Filled 2015-11-13: qty 5

## 2015-11-13 MED ORDER — LIDOCAINE HCL (CARDIAC) 20 MG/ML IV SOLN
INTRAVENOUS | Status: DC | PRN
  Administered 2015-11-13: 70 mg via INTRAVENOUS

## 2015-11-13 MED ORDER — ONDANSETRON HCL 4 MG/2ML IJ SOLN: 4.0000 mg | Freq: Once | INTRAMUSCULAR | Status: DC | PRN

## 2015-11-13 MED ORDER — FAMOTIDINE 20 MG PO TABS
20.0000 mg | ORAL_TABLET | Freq: Once | ORAL | Status: AC
  Administered 2015-11-13: 20 mg via ORAL

## 2015-11-13 MED ORDER — PROPOFOL 10 MG/ML IV BOLUS
INTRAVENOUS | Status: DC | PRN
  Administered 2015-11-13: 135 mg via INTRAVENOUS

## 2015-11-13 MED ORDER — ONDANSETRON 4 MG PO TBDP: 4.0000 mg | ORAL_TABLET | Freq: Four times a day (QID) | ORAL | Status: DC | PRN

## 2015-11-13 MED ORDER — ONDANSETRON HCL 4 MG/2ML IJ SOLN
INTRAMUSCULAR | Status: DC | PRN
  Administered 2015-11-13: 4 mg via INTRAVENOUS

## 2015-11-13 MED ORDER — KETOROLAC TROMETHAMINE 15 MG/ML IJ SOLN
15.0000 mg | Freq: Four times a day (QID) | INTRAMUSCULAR | Status: DC | PRN
  Filled 2015-11-13: qty 1

## 2015-11-13 MED ORDER — ONDANSETRON HCL 4 MG/2ML IJ SOLN: 4.0000 mg | Freq: Four times a day (QID) | INTRAMUSCULAR | Status: DC | PRN

## 2015-11-13 MED ORDER — FENTANYL CITRATE (PF) 100 MCG/2ML IJ SOLN: 25.0000 ug | INTRAMUSCULAR | Status: DC | PRN

## 2015-11-13 SURGICAL SUPPLY — 18 items
CANISTER SUCT 3000ML (MISCELLANEOUS) ×3 IMPLANT
CATH ROBINSON RED A/P 16FR (CATHETERS) ×3 IMPLANT
ELECT REM PT RETURN 9FT ADLT (ELECTROSURGICAL) ×3
ELECT RESECT POWERBALL 24F (MISCELLANEOUS) IMPLANT
ELECTRODE REM PT RTRN 9FT ADLT (ELECTROSURGICAL) ×1 IMPLANT
GLOVE BIO SURGEON STRL SZ7 (GLOVE) ×12 IMPLANT
GLOVE BIOGEL PI IND STRL 7.5 (GLOVE) ×4 IMPLANT
GLOVE BIOGEL PI INDICATOR 7.5 (GLOVE) ×8
GOWN STRL REUS W/ TWL LRG LVL3 (GOWN DISPOSABLE) ×2 IMPLANT
GOWN STRL REUS W/TWL LRG LVL3 (GOWN DISPOSABLE) ×4
IV LACTATED RINGERS 1000ML (IV SOLUTION) ×3 IMPLANT
KIT RM TURNOVER CYSTO AR (KITS) ×3 IMPLANT
LOOP CUT RT ANGL 28F (MISCELLANEOUS) IMPLANT
PACK DNC HYST (MISCELLANEOUS) ×3 IMPLANT
PAD OB MATERNITY 4.3X12.25 (PERSONAL CARE ITEMS) ×3 IMPLANT
PAD PREP 24X41 OB/GYN DISP (PERSONAL CARE ITEMS) ×3 IMPLANT
TUBING CONNECTING 10 (TUBING) ×2 IMPLANT
TUBING CONNECTING 10' (TUBING) ×1

## 2015-11-13 NOTE — Transfer of Care (Signed)
Immediate Anesthesia Transfer of Care Note  Patient: Susan Brooks  Procedure(s) Performed: Procedure(s): DILATATION AND CURETTAGE /HYSTEROSCOPY (N/A)  Patient Location: PACU  Anesthesia Type:General  Level of Consciousness: awake  Airway & Oxygen Therapy: Patient Spontanous Breathing  Post-op Assessment: Report given to RN  Post vital signs: stable  Last Vitals:  Filed Vitals:   11/13/15 1116 11/13/15 1336  BP: 152/77 146/71  Pulse:  65  Temp: 36.3 C 36.4 C  Resp:  20    Last Pain:  Filed Vitals:   11/13/15 1338  PainSc: Asleep         Complications: No apparent anesthesia complications  Past Medical History  Diagnosis Date  . Family history of adverse reaction to anesthesia     dad hallucinations  . Vertigo 2016  . Hypertension   . Arthritis   . Gout   . Hypercholesteremia    Past Surgical History  Procedure Laterality Date  . Breast biopsy Left 2012    core bx- neg  . Cholecystectomy    . Joint replacement Bilateral     hips and knees   Scheduled Meds:  Continuous Infusions: . lactated ringers 50 mL/hr at 11/13/15 1132   PRN Meds:.fentaNYL (SUBLIMAZE) injection, ondansetron (ZOFRAN) IV

## 2015-11-13 NOTE — Interval H&P Note (Signed)
History and Physical Interval Note:  11/13/2015 12:25 PM  Susan Brooks  has presented today for surgery, with the diagnosis of PMB  The various methods of treatment have been discussed with the patient and family. After consideration of risks, benefits and other options for treatment, the patient has consented to  Procedure(s): DILATATION AND CURETTAGE /HYSTEROSCOPY (N/A) as a surgical intervention .  The patient's history has been reviewed, patient examined, no change in status, stable for surgery.  I have reviewed the patient's chart and labs.  Questions were answered to the patient's satisfaction.     [place]

## 2015-11-13 NOTE — Discharge Instructions (Signed)
Hysteroscopy, Care After Refer to this sheet in the next few weeks. These instructions provide you with information on caring for yourself after your procedure. Your health care provider may also give you more specific instructions. Your treatment has been planned according to current medical practices, but problems sometimes occur. Call your health care provider if you have any problems or questions after your procedure.  WHAT TO EXPECT AFTER THE PROCEDURE After your procedure, it is typical to have the following:  You may have some cramping. This normally lasts for a couple days.  You may have bleeding. This can vary from light spotting for a few days to menstrual-like bleeding for up to 2 weeks. HOME CARE INSTRUCTIONS  Only take over-the-counter or prescription medicines as directed by your health care provider. Do not take aspirin. It can increase the chances of bleeding.  Take showers instead of baths for 2 weeks  Do not drive for 24 hours or as directed.  Do not drink alcohol while taking pain medicine.  Do not use tampons, douche, or have sexual intercourse for 2 weeks .  Do not swim or take baths for 2 weeks  You may resume all normal activities  If you develop constipation, you may:  Take a mild laxative if your health care provider approves.  Add bran foods to your diet.  Drink enough fluids to keep your urine clear or pale yellow.  Try to have someone with you or available to you for the first 24 hours since you were given general anesthetic.  Follow up with your health care provider as directed. SEEK MEDICAL CARE IF:  You feel dizzy or lightheaded.  You feel sick to your stomach (nauseous).  You have abnormal vaginal discharge.  You have a rash.  You have pain that is not controlled with medicine. SEEK IMMEDIATE MEDICAL CARE IF:  You have bleeding that is more than 2 pads/hour for 2 hours  You have a fever.  You have increasing cramps or pain, not  controlled with medicine.  You have new belly (abdominal) pain.  You pass out.  You have pain in the tops of your shoulders (shoulder strap areas).  You have shortness of breath.   This information is not intended to replace advice given to you by your health care provider. Make sure you discuss any questions you have with your health care provider.   Document Released: 03/06/2013 Document Reviewed: 03/06/2013 Elsevier Interactive Patient Education 2016 [place]   1) The drugs that you were given will stay in your system until tomorrow so for the next 24 hours you should not:  A) Drive an automobile B) Make any legal decisions C) Drink any alcoholic beverage   2) You may resume regular meals tomorrow.  Today it is better to start with liquids and gradually work up to solid foods.  You may eat anything you prefer, but it is better to start with liquids, then soup and crackers, and gradually work up to solid foods.   3) Please notify your doctor immediately if you have any unusual bleeding, trouble breathing, redness and pain at the surgery site, drainage, fever, or pain not relieved by medication.    4) Additional Instructions:        Please contact your physician with any problems or Same Day Surgery at [phone], Monday through Friday 6 am to 4 pm, or [place] at [facility] number at [phone].

## 2015-11-13 NOTE — Op Note (Signed)
Operative Report Hysteroscopy with Dilation and Curettage   Indications: PMB   Pre-operative Diagnosis: PMB   Post-operative Diagnosis: same.  Procedure: 1. Exam under anesthesia 2. Fractional D&C 3. Hysteroscopy 4. Polypectomy  Surgeon: Joylene Igo, MD  Assistant(s):  None  Anesthesia: General endotracheal anesthesia  Anesthesiologist: No responsible provider has been recorded for the case. Anesthesiologist: Gunnar Bulla, MD CRNA: Zetta Bills, CRNA  Estimated Blood Loss:  Minimal         Intraoperative medications: none         Total IV Fluids: 589ml  Urine Output: 106ml         Specimens: endometrial curettings and polyp         Complications:  None; patient tolerated the procedure well.         Disposition: PACU - hemodynamically stable.         Condition: stable  Findings: Uterus measuring 10 cm by sound; normal cervix, vagina, perineum.   Indication for procedure/Consents: 81 y.o. G3P3 here for scheduled surgery for the aforementioned diagnoses.  Risks of surgery were discussed with the patient including but not limited to: bleeding which may require transfusion; infection which may require antibiotics; injury to uterus or surrounding organs; intrauterine scarring which may impair future fertility; need for additional procedures including laparotomy or laparoscopy; and other postoperative/anesthesia complications. Written informed consent was obtained.    Procedure Details:   The patient was taken to the operating room where anesthesia was administered and was found to be adequate. After a formal and adequate timeout was performed, she was placed in the dorsal lithotomy position and examined with the above findings. She was then prepped and draped in the sterile manner. Her bladder was catheterized for an estimated amount of clear, yellow urine. A weighed speculum was then placed in the patient's vagina and a single tooth tenaculum was applied to the anterior  lip of the cervix.  Her cervix was serially dilated to 15 [place] using Hanks dilators.  Her uterus was sounded to 10 cm. The hysteroscope was introduced to reveal multiple endometrial polyps. Endometrial polypectomy was performed with polypectomy forceps.   The hysteroscope was reintroduced several times to ensure complete polypectomy and empty uterine cavity. A sharp curettage was then performed until there was a gritty texture in all four quadrants. The tenaculum was removed from the anterior lip of the cervix and the vaginal speculum was removed after applying silver nitrate for good hemostasis.   The patient tolerated the procedure well and was taken to the recovery area awake and in stable condition.   The patient will be discharged to home as per PACU criteria. Routine postoperative instructions given. She was prescribed Ibuprofen and Colace. She will follow up in the clinic in two weeks for postoperative evaluation.  Lorette Ang, MD

## 2015-11-13 NOTE — Anesthesia Procedure Notes (Signed)
Procedure Name: LMA Insertion Date/Time: 11/13/2015 12:50 PM Performed by: Zetta Bills Pre-anesthesia Checklist: Patient identified, Emergency Drugs available, Suction available and Patient being monitored Patient Re-evaluated:Patient Re-evaluated prior to inductionOxygen Delivery Method: Circle system utilized Preoxygenation: Pre-oxygenation with 100% oxygen Intubation Type: IV induction Ventilation: Mask ventilation without difficulty LMA: LMA inserted LMA Size: 4.0 Number of attempts: 1 Placement Confirmation: positive ETCO2,  CO2 detector and breath sounds checked- equal and bilateral Dental Injury: Teeth and Oropharynx as per pre-operative assessment

## 2015-11-13 NOTE — Anesthesia Preprocedure Evaluation (Addendum)
Anesthesia Evaluation  Patient identified by MRN, date of birth, ID band Patient awake    Reviewed: Allergy & Precautions, NPO status , Patient's Chart, lab work & pertinent test results, reviewed documented beta blocker date and time   History of Anesthesia Complications (+) Family history of anesthesia reaction  Airway Mallampati: II  TM Distance: >3 FB     Dental  (+) Chipped   Pulmonary           Cardiovascular hypertension, Pt. on medications and Pt. on home beta blockers      Neuro/Psych    GI/Hepatic   Endo/Other    Renal/GU      Musculoskeletal  (+) Arthritis ,   Abdominal   Peds  Hematology   Anesthesia Other Findings Gout. EKG Qs in III, otherwise normal. No cardiac symptoms.  Reproductive/Obstetrics                            Anesthesia Physical Anesthesia Plan  ASA: III  Anesthesia Plan: General   Post-op Pain Management:    Induction: Intravenous  Airway Management Planned: LMA  Additional Equipment:   Intra-op Plan:   Post-operative Plan:   Informed Consent: I have reviewed the patients History and Physical, chart, labs and discussed the procedure including the risks, benefits and alternatives for the proposed anesthesia with the patient or authorized representative who has indicated his/her understanding and acceptance.     Plan Discussed with: CRNA  Anesthesia Plan Comments:         Anesthesia Quick Evaluation

## 2015-11-13 NOTE — Anesthesia Postprocedure Evaluation (Signed)
Anesthesia Post Note  Patient: Susan Brooks  Procedure(s) Performed: Procedure(s) (LRB): DILATATION AND CURETTAGE /HYSTEROSCOPY (N/A)  Patient location during evaluation: PACU Anesthesia Type: General Level of consciousness: awake and alert Pain management: pain level controlled Vital Signs Assessment: post-procedure vital signs reviewed and stable Respiratory status: spontaneous breathing, nonlabored ventilation, respiratory function stable and patient connected to nasal cannula oxygen Cardiovascular status: blood pressure returned to baseline and stable Postop Assessment: no signs of nausea or vomiting Anesthetic complications: no    Last Vitals:  Filed Vitals:   11/13/15 1430 11/13/15 1450  BP: 157/56 163/77  Pulse: 61   Temp:    Resp: 15     Last Pain:  Filed Vitals:   11/13/15 1505  PainSc: 0-No pain                 Dalayna Lauter S

## 2015-11-16 ENCOUNTER — Encounter: Payer: Self-pay | Admitting: Obstetrics and Gynecology

## 2015-11-16 LAB — SURGICAL PATHOLOGY

## 2016-06-03 ENCOUNTER — Encounter: Payer: Self-pay | Admitting: *Deleted

## 2016-06-06 ENCOUNTER — Encounter
Admission: RE | Disposition: A | Discharge status: Home / Self Care | Payer: Self-pay | Source: Ambulatory Visit | Attending: Gastroenterology

## 2016-06-06 ENCOUNTER — Ambulatory Visit
Admission: RE | Admit: 2016-06-06 | Discharge: 2016-06-06 | Disposition: A | Discharge status: Home / Self Care | Payer: Medicare Other | Source: Ambulatory Visit | Attending: Gastroenterology | Admitting: Gastroenterology

## 2016-06-06 ENCOUNTER — Ambulatory Visit: Payer: Medicare Other | Admitting: Anesthesiology

## 2016-06-06 ENCOUNTER — Encounter: Payer: Self-pay | Admitting: *Deleted

## 2016-06-06 DIAGNOSIS — I129 Hypertensive chronic kidney disease with stage 1 through stage 4 chronic kidney disease, or unspecified chronic kidney disease: Secondary | ICD-10-CM | POA: Diagnosis not present

## 2016-06-06 DIAGNOSIS — D649 Anemia, unspecified: Secondary | ICD-10-CM | POA: Diagnosis not present

## 2016-06-06 DIAGNOSIS — I872 Venous insufficiency (chronic) (peripheral): Secondary | ICD-10-CM | POA: Insufficient documentation

## 2016-06-06 DIAGNOSIS — N189 Chronic kidney disease, unspecified: Secondary | ICD-10-CM | POA: Insufficient documentation

## 2016-06-06 DIAGNOSIS — D123 Benign neoplasm of transverse colon: Secondary | ICD-10-CM | POA: Insufficient documentation

## 2016-06-06 DIAGNOSIS — Z86010 Personal history of colonic polyps: Secondary | ICD-10-CM | POA: Diagnosis not present

## 2016-06-06 DIAGNOSIS — Z79899 Other long term (current) drug therapy: Secondary | ICD-10-CM | POA: Insufficient documentation

## 2016-06-06 DIAGNOSIS — D127 Benign neoplasm of rectosigmoid junction: Secondary | ICD-10-CM | POA: Insufficient documentation

## 2016-06-06 DIAGNOSIS — Z1211 Encounter for screening for malignant neoplasm of colon: Secondary | ICD-10-CM | POA: Insufficient documentation

## 2016-06-06 DIAGNOSIS — K56609 Unspecified intestinal obstruction, unspecified as to partial versus complete obstruction: Secondary | ICD-10-CM | POA: Diagnosis not present

## 2016-06-06 DIAGNOSIS — M1990 Unspecified osteoarthritis, unspecified site: Secondary | ICD-10-CM | POA: Insufficient documentation

## 2016-06-06 DIAGNOSIS — Z7982 Long term (current) use of aspirin: Secondary | ICD-10-CM | POA: Diagnosis not present

## 2016-06-06 DIAGNOSIS — M109 Gout, unspecified: Secondary | ICD-10-CM | POA: Insufficient documentation

## 2016-06-06 DIAGNOSIS — D124 Benign neoplasm of descending colon: Secondary | ICD-10-CM | POA: Diagnosis not present

## 2016-06-06 DIAGNOSIS — D125 Benign neoplasm of sigmoid colon: Secondary | ICD-10-CM | POA: Insufficient documentation

## 2016-06-06 DIAGNOSIS — E7800 Pure hypercholesterolemia, unspecified: Secondary | ICD-10-CM | POA: Insufficient documentation

## 2016-06-06 DIAGNOSIS — K5730 Diverticulosis of large intestine without perforation or abscess without bleeding: Secondary | ICD-10-CM | POA: Diagnosis not present

## 2016-06-06 HISTORY — DX: Headache, unspecified: R51.9

## 2016-06-06 HISTORY — DX: Headache: R51

## 2016-06-06 HISTORY — DX: Venous insufficiency (chronic) (peripheral): I87.2

## 2016-06-06 HISTORY — DX: Polyp of cervix uteri: N84.1

## 2016-06-06 HISTORY — PX: COLONOSCOPY WITH PROPOFOL: SHX5780

## 2016-06-06 HISTORY — DX: Postmenopausal bleeding: N95.0

## 2016-06-06 HISTORY — DX: Chronic kidney disease, unspecified: N18.9

## 2016-06-06 HISTORY — DX: Anemia, unspecified: D64.9

## 2016-06-06 SURGERY — COLONOSCOPY WITH PROPOFOL
Anesthesia: General

## 2016-06-06 MED ORDER — PROPOFOL 500 MG/50ML IV EMUL
INTRAVENOUS | Status: AC
  Filled 2016-06-06: qty 50

## 2016-06-06 MED ORDER — PROPOFOL 10 MG/ML IV BOLUS
INTRAVENOUS | Status: DC | PRN
  Administered 2016-06-06: 60 mg via INTRAVENOUS

## 2016-06-06 MED ORDER — PROPOFOL 500 MG/50ML IV EMUL
INTRAVENOUS | Status: DC | PRN
  Administered 2016-06-06: 140 ug/kg/min via INTRAVENOUS

## 2016-06-06 MED ORDER — SODIUM CHLORIDE 0.9 % IV SOLN
INTRAVENOUS | Status: DC
  Administered 2016-06-06: 1000 mL via INTRAVENOUS
  Administered 2016-06-06: 14:00:00 via INTRAVENOUS

## 2016-06-06 MED ORDER — SODIUM CHLORIDE 0.9 % IV SOLN: INTRAVENOUS | Status: DC

## 2016-06-06 MED ORDER — SODIUM CHLORIDE 0.9 % IV SOLN
2.0000 g | Freq: Once | INTRAVENOUS | Status: AC
  Administered 2016-06-06: 2 g via INTRAVENOUS

## 2016-06-06 NOTE — Transfer of Care (Signed)
Immediate Anesthesia Transfer of Care Note  Patient: Susan Brooks  Procedure(s) Performed: Procedure(s): COLONOSCOPY WITH PROPOFOL (N/A)  Patient Location: PACU  Anesthesia Type:General  Level of Consciousness: awake and sedated  Airway & Oxygen Therapy: Patient Spontanous Breathing and Patient connected to nasal cannula oxygen  Post-op Assessment: Report given to RN and Post -op Vital signs reviewed and stable  Post vital signs: Reviewed and stable  Last Vitals:  Vitals:   06/06/16 1309 06/06/16 1613  BP: (!) 150/68   Pulse: 60   Resp: 18   Temp: 36.2 C (!) (P) 35.7 C    Last Pain:  Vitals:   06/06/16 1613  TempSrc: (P) Tympanic         Complications: No apparent anesthesia complications

## 2016-06-06 NOTE — H&P (Signed)
Outpatient short stay form Pre-procedure 06/06/2016 2:23 PM Lollie Sails MD  Primary Physician: Dr. Fulton Reek  Reason for visit:  Colonoscopy  History of present illness:  Patient is a 82 year old female presenting today as above. She has a personal history of colon polyps. Her last colonoscopy was 5 years ago. She tolerated her prep well. She takes no blood thinning agents she does take a mini dose/81 mg aspirin however is not taking that for several days.    Current Facility-Administered Medications:  .  0.9 %  sodium chloride infusion, , Intravenous, Continuous, Lollie Sails, MD, Last Rate: 20 mL/hr at 06/06/16 1334, 1,000 mL at 06/06/16 1334 .  0.9 %  sodium chloride infusion, , Intravenous, Continuous, Lollie Sails, MD  Prescriptions Prior to Admission  Medication Sig Dispense Refill Last Dose  . acetaminophen (TYLENOL) 500 MG tablet Take 1,000 mg by mouth every 6 (six) hours as needed.   Past Week at Unknown time  . allopurinol (ZYLOPRIM) 300 MG tablet Take 300 mg by mouth every morning.   06/05/2016 at Unknown time  . aspirin 81 MG tablet Take 81 mg by mouth every morning.   Past Week at Unknown time  . calcium citrate-vitamin D (CITRACAL+D) 315-200 MG-UNIT tablet Take 1 tablet by mouth every morning.   Past Week at Unknown time  . fenofibrate (TRICOR) 48 MG tablet Take 48 mg by mouth at bedtime.   Past Week at Unknown time  . fexofenadine-pseudoephedrine (ALLEGRA-D 24) 180-240 MG 24 hr tablet Take 1 tablet by mouth daily as needed.   Past Week at Unknown time  . losartan-hydrochlorothiazide (HYZAAR) 100-12.5 MG tablet Take 1 tablet by mouth daily. In am.   Past Week at Unknown time  . meclizine (ANTIVERT) 25 MG tablet Take 25 mg by mouth as needed for dizziness (vertigo).   Past Week at Unknown time  . Multiple Vitamins-Minerals (MULTIVITAMIN WITH MINERALS) tablet Take 1 tablet by mouth every morning.   Past Week at Unknown time  . Omega 3-6-9 Fatty Acids (OMEGA-3 &  OMEGA-6 FISH OIL PO) Take 1 capsule by mouth every morning.   Past Week at Unknown time  . propranolol ER (INDERAL LA) 120 MG 24 hr capsule Take 120 mg by mouth 2 (two) times daily.   06/06/2016 at Unknown time  . sennosides-docusate sodium (SENOKOT-S) 8.6-50 MG tablet Take 1 tablet by mouth as needed for constipation.   Past Week at Unknown time  . vitamin E 400 UNIT capsule Take 400 Units by mouth every morning.   Past Week at Unknown time  . amoxicillin (AMOXIL) 500 MG capsule Take 2,000 mg by mouth as needed (prior to dental work). Reported on 11/13/2015   Completed Course at Unknown time     Allergies  Allergen Reactions  . Oxycodone Nausea Only  . Prednisone Other (See Comments)    hallucinations  . Sulfa Antibiotics Nausea Only     Past Medical History:  Diagnosis Date  . Anemia   . Arthritis   . Cervical polyp   . Chronic kidney disease   . Chronic venous insufficiency   . Family history of adverse reaction to anesthesia    dad hallucinations  . Gout   . Headache   . Hypercholesteremia   . Hypertension   . PMB (postmenopausal bleeding)   . Vertigo 2016    Review of systems:      Physical Exam    Heart and lungs: Regular rate and rhythm without rub or  gallop, lungs are bilaterally clear.    HEENT: Septic atraumatic eyes are anicteric    Other:     Pertinant exam for procedure: Soft nontender nondistended bowel sounds positive normoactive.    Planned proceedures: Colonoscopy and indicated procedures. I have discussed the risks benefits and complications of procedures to include not limited to bleeding, infection, perforation and the risk of sedation and the patient wishes to proceed.    Lollie Sails, MD Gastroenterology 06/06/2016  2:23 PM

## 2016-06-06 NOTE — Anesthesia Procedure Notes (Signed)
Date/Time: 06/06/2016 2:47 PM Performed by: Nelda Marseille Pre-anesthesia Checklist: Patient identified, Emergency Drugs available, Suction available, Patient being monitored and Timeout performed Oxygen Delivery Method: Nasal cannula

## 2016-06-06 NOTE — Op Note (Signed)
[facility] Gastroenterology Patient Name: Susan Brooks Procedure Date: 06/06/2016 2:21 PM MRN: [id number] Account #: [id number] Date of Birth: 08/25/1934 Admit Type: Outpatient Age: 82 Room: [facility] ENDO ROOM 3 Gender: Female Note Status: Finalized Procedure:            Colonoscopy Indications:          Personal history of colonic polyps Providers:            Lollie Sails, MD Medicines:            Monitored Anesthesia Care Complications:        No immediate complications. Procedure:            Pre-Anesthesia Assessment:                       - ASA Grade Assessment: III - A patient with severe                        systemic disease.                       After obtaining informed consent, the colonoscope was                        passed under direct vision. Throughout the procedure,                        the patient's blood pressure, pulse, and oxygen                        saturations were monitored continuously. The                        Colonoscope was introduced through the anus with the                        intention of advancing to the cecum. The scope was                        advanced to the ascending colon before the procedure                        was aborted. Medications were given. The colonoscopy                        was unusually difficult due to abnormal anatomy. The                        patient tolerated the procedure well. The quality of                        the bowel preparation was fair except the ascending                        colon was poor. Findings:      A 5 mm polyp was found in the mid sigmoid colon. The polyp was sessile.       The polyp was removed with a cold snare. Resection and retrieval were       complete.      A 9 mm polyp was found in the descending colon.  The polyp was       pedunculated. The polyp was removed with a hot snare. Resection and       retrieval were complete.      Two flat polyps were found in  the descending colon. The polyps were 3 to       4 mm in size. These polyps were removed with a cold snare. Resection and       retrieval were complete.      A 2 mm polyp was found in the splenic flexure. The polyp was sessile.       The polyp was removed with a cold biopsy forceps. Resection and       retrieval were complete.      Two sessile polyps were found in the hepatic flexure. The polyps were 3       to 4 mm in size. These polyps were removed with a cold snare. Resection       and retrieval were complete.      A 2 mm polyp was found in the hepatic flexure. The polyp was sessile.       The polyp was removed with a cold biopsy forceps. Resection and       retrieval were complete.      A 12 mm polyp was found in the proximal sigmoid colon. The polyp was       pedunculated. The polyp was removed with a hot snare. Resection and       retrieval were complete.      A 4 mm polyp was found in the recto-sigmoid colon. The polyp was       pedunculated. The polyp was removed with a hot snare. Resection and       retrieval were complete.      Multiple small-mouthed diverticula were found in the sigmoid colon,       descending colon and transverse colon.      A benign-appearing, intrinsic moderate stenosis measuring 3 cm (in       length) x 1 cm (inner diameter) was found at the hepatic flexure and was       traversed. Area was tattooed with an injection of 2 mL of [place] ink. It       was very difficult to pass beyond the stenosis, and one the tip of the       scope was maneuvered beyond, it was noticed a long run of apparent       ascending colon, with multiple polyps present. Despite multiple efforts       with abdominal support and movement I was unable to advance further. Impression:           - One 5 mm polyp in the mid sigmoid colon, removed with                        a cold snare. Resected and retrieved.                       - One 9 mm polyp in the descending colon, removed with                         a hot snare. Resected and retrieved.                       - Two 3 to 4 mm polyps in the  descending colon, removed                        with a cold snare. Resected and retrieved.                       - One 2 mm polyp at the splenic flexure, removed with a                        cold biopsy forceps. Resected and retrieved.                       - Two 3 to 4 mm polyps at the hepatic flexure, removed                        with a cold snare. Resected and retrieved.                       - One 2 mm polyp at the hepatic flexure, removed with a                        cold biopsy forceps. Resected and retrieved.                       - One 12 mm polyp in the proximal sigmoid colon,                        removed with a hot snare. Resected and retrieved.                       - One 4 mm polyp at the recto-sigmoid colon, removed                        with a hot snare. Resected and retrieved.                       - Diverticulosis in the sigmoid colon, in the                        descending colon and in the transverse colon.                       - Stricture at the hepatic flexure. Tattooed. Recommendation:       - Discharge patient to home.                       - Await pathology results.                       - Return to GI clinic in 2 weeks. Consider repeat                        colonoscopy versus surgical referral. Will discuss                        further with patient. Procedure Code(s):    --- Professional ---                       [phone], 52, Colonoscopy, flexible; with removal of  tumor(s), polyp(s), or other lesion(s) by snare                        technique                       45380, 59,52, Colonoscopy, flexible; with biopsy,                        single or multiple                       45381, 52, Colonoscopy, flexible; with directed                        submucosal injection(s), any substance Diagnosis Code(s):    --- Professional  ---                       D12.4, Benign neoplasm of descending colon                       D12.3, Benign neoplasm of transverse colon (hepatic                        flexure or splenic flexure)                       D12.5, Benign neoplasm of sigmoid colon                       D12.7, Benign neoplasm of rectosigmoid junction                       K56.69, Other intestinal obstruction                       Z86.010, Personal history of colonic polyps                       K57.30, Diverticulosis of large intestine without                        perforation or abscess without bleeding CPT copyright 2016 American Medical Association. All rights reserved. The codes documented in this report are preliminary and upon coder review may  be revised to meet current compliance requirements. Lollie Sails, MD 06/06/2016 4:14:58 PM This report has been signed electronically. Number of Addenda: 0 Note Initiated On: 06/06/2016 2:21 PM Total Procedure Duration: 1 hour 21 minutes 50 seconds       [facility]

## 2016-06-06 NOTE — Anesthesia Preprocedure Evaluation (Signed)
Anesthesia Evaluation  Patient identified by MRN, date of birth, ID band Patient awake    Reviewed: Allergy & Precautions, NPO status , Patient's Chart, lab work & pertinent test results  History of Anesthesia Complications Negative for: history of anesthetic complications  Airway Mallampati: II  TM Distance: >3 FB Neck ROM: Full    Dental  (+) Implants   Pulmonary neg pulmonary ROS, neg sleep apnea, neg COPD,    breath sounds clear to auscultation- rhonchi (-) wheezing      Cardiovascular Exercise Tolerance: Good hypertension, Pt. on medications (-) CAD and (-) Past MI  Rhythm:Regular Rate:Normal - Systolic murmurs and - Diastolic murmurs    Neuro/Psych  Headaches, negative psych ROS   GI/Hepatic negative GI ROS, Neg liver ROS,   Endo/Other  negative endocrine ROSneg diabetes  Renal/GU Renal InsufficiencyRenal disease     Musculoskeletal  (+) Arthritis ,   Abdominal (+) + obese,   Peds  Hematology  (+) anemia ,   Anesthesia Other Findings Past Medical History: No date: Anemia No date: Arthritis No date: Cervical polyp No date: Chronic kidney disease No date: Chronic venous insufficiency No date: Family history of adverse reaction to anesthes*     Comment: dad hallucinations No date: Gout No date: Headache No date: Hypercholesteremia No date: Hypertension No date: PMB (postmenopausal bleeding) 2016: Vertigo   Reproductive/Obstetrics                             Anesthesia Physical Anesthesia Plan  ASA: III  Anesthesia Plan: General   Post-op Pain Management:    Induction: Intravenous  Airway Management Planned: Natural Airway  Additional Equipment:   Intra-op Plan:   Post-operative Plan:   Informed Consent: I have reviewed the patients History and Physical, chart, labs and discussed the procedure including the risks, benefits and alternatives for the proposed  anesthesia with the patient or authorized representative who has indicated his/her understanding and acceptance.   Dental advisory given  Plan Discussed with: CRNA and Anesthesiologist  Anesthesia Plan Comments:         Anesthesia Quick Evaluation

## 2016-06-06 NOTE — Anesthesia Postprocedure Evaluation (Signed)
Anesthesia Post Note  Patient: Susan Brooks  Procedure(s) Performed: Procedure(s) (LRB): COLONOSCOPY WITH PROPOFOL (N/A)  Patient location during evaluation: Endoscopy Anesthesia Type: General Level of consciousness: awake and alert Pain management: pain level controlled Vital Signs Assessment: post-procedure vital signs reviewed and stable Respiratory status: spontaneous breathing and respiratory function stable Cardiovascular status: stable Anesthetic complications: no     Last Vitals:  Vitals:   06/06/16 1309 06/06/16 1613  BP: (!) 150/68   Pulse: 60   Resp: 18   Temp: 36.2 C (!) 35.7 C    Last Pain:  Vitals:   06/06/16 1613  TempSrc: Tympanic                 KEPHART,WILLIAM K

## 2016-06-07 ENCOUNTER — Encounter: Payer: Self-pay | Admitting: Gastroenterology

## 2016-06-07 ENCOUNTER — Other Ambulatory Visit: Payer: Self-pay | Admitting: Gastroenterology

## 2016-06-07 DIAGNOSIS — Q438 Other specified congenital malformations of intestine: Secondary | ICD-10-CM

## 2016-06-08 LAB — SURGICAL PATHOLOGY

## 2016-06-28 ENCOUNTER — Ambulatory Visit: Payer: Medicare Other

## 2016-06-29 ENCOUNTER — Other Ambulatory Visit: Payer: Self-pay | Admitting: Gastroenterology

## 2016-06-29 ENCOUNTER — Ambulatory Visit
Admission: RE | Admit: 2016-06-29 | Discharge: 2016-06-29 | Disposition: A | Discharge status: Home / Self Care | Payer: Medicare Other | Source: Ambulatory Visit | Attending: Gastroenterology | Admitting: Gastroenterology

## 2016-06-29 DIAGNOSIS — Q438 Other specified congenital malformations of intestine: Secondary | ICD-10-CM

## 2016-06-29 DIAGNOSIS — R938 Abnormal findings on diagnostic imaging of other specified body structures: Secondary | ICD-10-CM | POA: Diagnosis not present

## 2016-06-29 DIAGNOSIS — Z86010 Personal history of colonic polyps: Secondary | ICD-10-CM | POA: Diagnosis present

## 2016-07-08 ENCOUNTER — Other Ambulatory Visit: Payer: Self-pay | Admitting: Gastroenterology

## 2016-07-08 DIAGNOSIS — Z86010 Personal history of colonic polyps: Secondary | ICD-10-CM

## 2016-07-08 DIAGNOSIS — Q438 Other specified congenital malformations of intestine: Secondary | ICD-10-CM

## 2016-07-20 ENCOUNTER — Ambulatory Visit
Admission: RE | Admit: 2016-07-20 | Discharge: 2016-07-20 | Disposition: A | Discharge status: Home / Self Care | Payer: Medicare Other | Source: Ambulatory Visit | Attending: Gastroenterology | Admitting: Gastroenterology

## 2016-07-20 DIAGNOSIS — Q438 Other specified congenital malformations of intestine: Secondary | ICD-10-CM

## 2016-07-20 DIAGNOSIS — Z86010 Personal history of colonic polyps: Secondary | ICD-10-CM

## 2016-07-21 ENCOUNTER — Other Ambulatory Visit: Payer: Self-pay | Admitting: Internal Medicine

## 2016-07-21 DIAGNOSIS — Z1231 Encounter for screening mammogram for malignant neoplasm of breast: Secondary | ICD-10-CM

## 2016-09-01 ENCOUNTER — Inpatient Hospital Stay: Payer: Medicare Other | Admitting: Oncology

## 2016-09-05 ENCOUNTER — Encounter: Payer: Self-pay | Admitting: Oncology

## 2016-09-05 ENCOUNTER — Inpatient Hospital Stay: Payer: Medicare Other | Attending: Oncology | Admitting: Oncology

## 2016-09-05 VITALS — BP 118/72 | HR 68 | Temp 97.4°F | Resp 20 | Ht 66.54 in | Wt 185.7 lb

## 2016-09-05 DIAGNOSIS — D649 Anemia, unspecified: Secondary | ICD-10-CM | POA: Insufficient documentation

## 2016-09-05 DIAGNOSIS — I872 Venous insufficiency (chronic) (peripheral): Secondary | ICD-10-CM | POA: Insufficient documentation

## 2016-09-05 DIAGNOSIS — Z7982 Long term (current) use of aspirin: Secondary | ICD-10-CM | POA: Insufficient documentation

## 2016-09-05 DIAGNOSIS — N189 Chronic kidney disease, unspecified: Secondary | ICD-10-CM | POA: Diagnosis not present

## 2016-09-05 DIAGNOSIS — G8929 Other chronic pain: Secondary | ICD-10-CM | POA: Insufficient documentation

## 2016-09-05 DIAGNOSIS — M109 Gout, unspecified: Secondary | ICD-10-CM | POA: Insufficient documentation

## 2016-09-05 DIAGNOSIS — Z96653 Presence of artificial knee joint, bilateral: Secondary | ICD-10-CM | POA: Diagnosis not present

## 2016-09-05 DIAGNOSIS — I129 Hypertensive chronic kidney disease with stage 1 through stage 4 chronic kidney disease, or unspecified chronic kidney disease: Secondary | ICD-10-CM | POA: Insufficient documentation

## 2016-09-05 DIAGNOSIS — K5730 Diverticulosis of large intestine without perforation or abscess without bleeding: Secondary | ICD-10-CM | POA: Diagnosis not present

## 2016-09-05 DIAGNOSIS — I10 Essential (primary) hypertension: Secondary | ICD-10-CM | POA: Insufficient documentation

## 2016-09-05 DIAGNOSIS — Z79899 Other long term (current) drug therapy: Secondary | ICD-10-CM | POA: Insufficient documentation

## 2016-09-05 DIAGNOSIS — E785 Hyperlipidemia, unspecified: Secondary | ICD-10-CM | POA: Insufficient documentation

## 2016-09-05 DIAGNOSIS — Z86010 Personal history of colonic polyps: Secondary | ICD-10-CM | POA: Insufficient documentation

## 2016-09-05 DIAGNOSIS — M170 Bilateral primary osteoarthritis of knee: Secondary | ICD-10-CM | POA: Insufficient documentation

## 2016-09-05 DIAGNOSIS — N950 Postmenopausal bleeding: Secondary | ICD-10-CM | POA: Insufficient documentation

## 2016-09-05 DIAGNOSIS — D509 Iron deficiency anemia, unspecified: Secondary | ICD-10-CM | POA: Diagnosis not present

## 2016-09-05 DIAGNOSIS — N289 Disorder of kidney and ureter, unspecified: Secondary | ICD-10-CM | POA: Insufficient documentation

## 2016-09-05 DIAGNOSIS — R51 Headache: Secondary | ICD-10-CM

## 2016-09-05 DIAGNOSIS — M1990 Unspecified osteoarthritis, unspecified site: Secondary | ICD-10-CM | POA: Insufficient documentation

## 2016-09-05 DIAGNOSIS — E7800 Pure hypercholesterolemia, unspecified: Secondary | ICD-10-CM | POA: Insufficient documentation

## 2016-09-05 LAB — CBC WITH DIFFERENTIAL/PLATELET
Basophils Absolute: 0.1 10*3/uL (ref 0–0.1)
Basophils Relative: 1 %
Eosinophils Absolute: 0.3 10*3/uL (ref 0–0.7)
Eosinophils Relative: 4 %
HEMATOCRIT: 26.1 % — AB (ref 35.0–47.0)
Hemoglobin: 8.2 g/dL — ABNORMAL LOW (ref 12.0–16.0)
LYMPHS ABS: 2.1 10*3/uL (ref 1.0–3.6)
LYMPHS PCT: 30 %
MCH: 25 pg — ABNORMAL LOW (ref 26.0–34.0)
MCHC: 31.5 g/dL — ABNORMAL LOW (ref 32.0–36.0)
MCV: 79.3 fL — AB (ref 80.0–100.0)
MONOS PCT: 7 %
Monocytes Absolute: 0.5 10*3/uL (ref 0.2–0.9)
NEUTROS ABS: 4.1 10*3/uL (ref 1.4–6.5)
Neutrophils Relative %: 58 %
Platelets: 316 10*3/uL (ref 150–440)
RBC: 3.29 MIL/uL — ABNORMAL LOW (ref 3.80–5.20)
RDW: 17.6 % — AB (ref 11.5–14.5)
WBC: 7.1 10*3/uL (ref 3.6–11.0)

## 2016-09-05 LAB — FOLATE: FOLATE: 12.5 ng/mL (ref >5.9)

## 2016-09-05 LAB — RETICULOCYTES
RBC.: 3.27 MIL/uL — ABNORMAL LOW (ref 3.80–5.20)
Retic Count, Absolute: 88.3 10*3/uL (ref 19.0–183.0)
Retic Ct Pct: 2.7 % (ref 0.4–3.1)

## 2016-09-05 LAB — FERRITIN: Ferritin: 15 ng/mL (ref 11–307)

## 2016-09-05 LAB — IRON AND TIBC
Iron: 13 ug/dL — ABNORMAL LOW (ref 28–170)
SATURATION RATIOS: 2 % — AB (ref 10.4–31.8)
TIBC: 549 ug/dL — ABNORMAL HIGH (ref 250–450)
UIBC: 536 ug/dL

## 2016-09-05 LAB — TSH: TSH: 1.77 u[IU]/mL (ref 0.350–4.500)

## 2016-09-05 LAB — VITAMIN B12: Vitamin B-12: 617 pg/mL (ref 180–914)

## 2016-09-05 MED ORDER — SODIUM CHLORIDE 0.9 % IV SOLN
510.0000 mg | Freq: Once | INTRAVENOUS | Status: DC
  Filled 2016-09-05: qty 17

## 2016-09-05 MED ORDER — SODIUM CHLORIDE 0.9 % IV SOLN: 510.0000 mg | Freq: Once | INTRAVENOUS | Status: DC

## 2016-09-05 NOTE — Progress Notes (Signed)
New patient here for Anemia. Reports this is a new problem. Has noticed increasing fatigue. Energy diminishes throughout the day. Mild dyspnea with exertion. Recently diagnosed with Vertigo/ inner ear problems. Currently seeing a therapist for this.Bowels normal, no visible blood in stool.

## 2016-09-05 NOTE — Progress Notes (Signed)
Hematology/Oncology Consult note [facility] Telephone:([phone] Fax:([phone]  Patient Care Team: Idelle Crouch, MD as PCP - General (Internal Medicine)   Name of the patient: Susan Brooks  [id number]  08-Jul-1934    Reason for referral- iron deficiency anemia   Referring physician- Dr. Doy Hutching  Date of visit: 09/05/16   History of presenting illness- patient is a 82 year old female who has been referred to [place] for evaluation and management of iron deficiency anemia. CBC from 08/19/2016 showed white count of 7.7, H&H of 8.3/26.4 with an MCV of 83.5 and platelet count of 328. ESR was elevated at 79. CMP was significant for elevated BUN of 26 and creatinine of 1.3. LFTs were within normal limits. Iron studies from 07/04/2016 showed a low ferritin of 19, I'll elevated TIBC of 569 and low iron saturation of 5%  Patient follows up with [facility] and recently had a colonoscopy in January 2018 which showed: 05/2016--Colonoscopy-5 mm polyp sigmoid, 9 mm polyp descending, 2 flat polyps ascending, 2 mm polyp splenic flexure, 2 sessile polyps hepatic flexure, 12 mm polyp proximal sigmoid, 4 mm polyp rectosigmoid. Diverticulosis in sigmoid, descending, transverse. Benign appearing intrinsic stenosis 3 cm at hepatic flexure, tattooed, unable to advance pass intrinsic stenosis at hepatic flexure to see acsending colon. Polyp pathology with multiple tubular adenomas  Colonoscope could not be traveling was beyond the hepatic flexure and she underwent virtual colonoscopy which she tells me showed small polyps. Patient denies any routine use of NSAIDs. She has not had any EGD or capsule study before. She has been on oral iron for 2 weeks now and is tolerating it well. Denies any black tarry stools or blood in her stools. No family history of colon cancer.    ECOG PS- 0  Pain scale- 0   Review of systems- Review of Systems  Constitutional: Positive for  malaise/fatigue. Negative for chills, fever and weight loss.  HENT: Negative for congestion, ear discharge and nosebleeds.   Eyes: Negative for blurred vision.  Respiratory: Negative for cough, hemoptysis, sputum production, shortness of breath and wheezing.   Cardiovascular: Negative for chest pain, palpitations, orthopnea and claudication.  Gastrointestinal: Negative for abdominal pain, blood in stool, constipation, diarrhea, heartburn, melena, nausea and vomiting.  Genitourinary: Negative for dysuria, flank pain, frequency, hematuria and urgency.  Musculoskeletal: Negative for back pain, joint pain and myalgias.  Skin: Negative for rash.  Neurological: Negative for dizziness, tingling, focal weakness, seizures, weakness and headaches.       + vertigo  Endo/Heme/Allergies: Does not bruise/bleed easily.  Psychiatric/Behavioral: Negative for depression and suicidal ideas. The patient does not have insomnia.     Allergies  Allergen Reactions  . Oxycodone Nausea Only  . Prednisone Other (See Comments)    hallucinations  . Sulfa Antibiotics Nausea Only    Patient Active Problem List   Diagnosis Date Noted  . Anemia 09/05/2016  . Chronic headaches 09/05/2016  . Chronic venous insufficiency 09/05/2016  . Gout 09/05/2016  . Hyperlipidemia, unspecified 09/05/2016  . Hypertension 09/05/2016  . Osteoarthritis 09/05/2016  . PMB (postmenopausal bleeding) 09/05/2016  . Renal insufficiency 09/05/2016  . Arthritis of left knee 05/21/2013  . Left knee pain 05/21/2013  . Arthritis of both knees 01/20/2012     Past Medical History:  Diagnosis Date  . Anemia   . Arthritis   . Cervical polyp   . Chronic kidney disease   . Chronic venous insufficiency   . Family history of adverse reaction  to anesthesia    dad hallucinations  . Gout   . Headache   . Hypercholesteremia   . Hypertension   . PMB (postmenopausal bleeding)   . Vertigo 2016     Past Surgical History:  Procedure  Laterality Date  . APPENDECTOMY    . bilateral hip replacments and bilateral knee replacements Bilateral   . CHOLECYSTECTOMY    . COLONOSCOPY WITH PROPOFOL N/A 06/06/2016   Procedure: COLONOSCOPY WITH PROPOFOL;  Surgeon: Lollie Sails, MD;  Location: [facility] ENDOSCOPY;  Service: Endoscopy;  Laterality: N/A;  . DILATION AND CURETTAGE OF UTERUS    . HYSTEROSCOPY W/D&C N/A 11/13/2015   Procedure: DILATATION AND CURETTAGE /HYSTEROSCOPY;  Surgeon: Lorette Ang, MD;  Location: [facility] ORS;  Service: Gynecology;  Laterality: N/A;  . JOINT REPLACEMENT Bilateral    hips and knees  . MOUTH SURGERY    . TUBAL LIGATION      Social History   Social History  . Marital status: Widowed    Spouse name: N/A  . Number of children: N/A  . Years of education: N/A   Occupational History  . Not on file.   Social History Main Topics  . Smoking status: Never Smoker  . Smokeless tobacco: Never Used  . Alcohol use Yes  . Drug use: No  . Sexual activity: Not on file   Other Topics Concern  . Not on file   Social History Narrative  . No narrative on file     Family History  Problem Relation Age of Onset  . Breast cancer Neg Hx      Current Outpatient Prescriptions:  .  acetaminophen (TYLENOL) 500 MG tablet, Take 1,000 mg by mouth every 6 (six) hours as needed., Disp: , Rfl:  .  allopurinol (ZYLOPRIM) 300 MG tablet, Take 300 mg by mouth every morning., Disp: , Rfl:  .  amoxicillin (AMOXIL) 500 MG capsule, Take 2,000 mg by mouth as needed (prior to dental work). Reported on 11/13/2015, Disp: , Rfl:  .  aspirin 81 MG tablet, Take 81 mg by mouth every morning., Disp: , Rfl:  .  calcium citrate-vitamin D (CITRACAL+D) 315-200 MG-UNIT tablet, Take 1 tablet by mouth every morning., Disp: , Rfl:  .  fenofibrate (TRICOR) 48 MG tablet, Take 48 mg by mouth at bedtime., Disp: , Rfl:  .  losartan-hydrochlorothiazide (HYZAAR) 100-12.5 MG tablet, Take 1 tablet by mouth daily. In am., Disp: , Rfl:  .   meclizine (ANTIVERT) 25 MG tablet, Take 25 mg by mouth as needed for dizziness (vertigo)., Disp: , Rfl:  .  Multiple Vitamins-Minerals (MULTIVITAMIN WITH MINERALS) tablet, Take 1 tablet by mouth every morning., Disp: , Rfl:  .  Omega 3-6-9 Fatty Acids (OMEGA-3 & OMEGA-6 FISH OIL PO), Take 1 capsule by mouth every morning., Disp: , Rfl:  .  polyethylene glycol powder (GLYCOLAX/MIRALAX) powder, Take 17 g by mouth daily as needed., Disp: , Rfl:  .  propranolol ER (INDERAL LA) 120 MG 24 hr capsule, Take 120 mg by mouth 2 (two) times daily., Disp: , Rfl:  .  sennosides-docusate sodium (SENOKOT-S) 8.6-50 MG tablet, Take 1 tablet by mouth as needed for constipation., Disp: , Rfl:  .  vitamin E 400 UNIT capsule, Take 400 Units by mouth every morning., Disp: , Rfl:    Physical exam:  Vitals:   09/05/16 1136  BP: 118/72  Pulse: 68  Resp: 20  Temp: 97.4 F (36.3 C)  TempSrc: Tympanic  SpO2: 98%  Weight: 185 lb  11.8 oz (84.2 kg)  Height: 5' 6.53" (1.69 m)   Physical Exam  Constitutional: She is oriented to person, place, and time and well-developed, well-nourished, and in no distress.  HENT:  Head: Normocephalic and atraumatic.  Eyes: EOM are normal. Pupils are equal, round, and reactive to light.  Neck: Normal range of motion.  Cardiovascular: Normal rate, regular rhythm and normal heart sounds.   Pulmonary/Chest: Effort normal and breath sounds normal.  Abdominal: Soft. Bowel sounds are normal.  Neurological: She is alert and oriented to person, place, and time.  Skin: Skin is warm and dry.       CMP Latest Ref Rng & Units 11/04/2015  Glucose 65 - 99 mg/dL 105(H)  BUN 6 - 20 mg/dL 25(H)  Creatinine 0.44 - 1.00 mg/dL 1.22(H)  Sodium 135 - 145 mmol/L 140  Potassium 3.5 - 5.1 mmol/L 3.7  Chloride 101 - 111 mmol/L 105  CO2 22 - 32 mmol/L 26  Calcium 8.9 - 10.3 mg/dL 10.2  Total Protein 6.5 - 8.1 g/dL 7.3  Total Bilirubin 0.3 - 1.2 mg/dL 0.6  Alkaline Phos 38 - 126 U/L 68  AST 15 - 41  U/L 59(H)  ALT 14 - 54 U/L 51   CBC Latest Ref Rng & Units 11/04/2015  WBC 3.6 - 11.0 K/uL 7.0  Hemoglobin 12.0 - 16.0 g/dL 12.5  Hematocrit 35.0 - 47.0 % 37.0  Platelets 150 - 440 K/uL 217    No images are attached to the encounter.  No results found.  Assessment and plan- Patient is a 82 y.o. female who has been referred to [place] for evaluation and management of iron deficiency anemia  Today I will do a complete anemia workup including CBC with differential, ferritin and iron studies, B12 and folate, TSH, reticulocyte count and haptoglobin and myeloma panel. I will also check for celiac disease panel and stool H. pylori antigen along with urinalysis.   Given that patient has severe iron deficiency anemia based on recent labs on the Feraheme 510 mg weekly. I will see her back in 7 weeks' time with repeat CBC and iron studies to see if she'll respond to it and if she needs further doses.  Patient has an upcoming appointment with GI in 2 weeks' time and I will touch base with their office. She will need complete endoscopy evaluation including EGD plus minus capsule endoscopy to evaluate her iron deficiency anemia further   Thank you for this kind referral and the opportunity to participate in the care of this patient   Visit Diagnosis 1. Iron deficiency anemia, unspecified iron deficiency anemia type     Dr. Randa Evens, MD, MPH [facility] Pager- [phone] 09/05/2016  12:13 PM

## 2016-09-06 ENCOUNTER — Inpatient Hospital Stay: Payer: Medicare Other

## 2016-09-06 ENCOUNTER — Ambulatory Visit: Payer: Medicare Other

## 2016-09-06 VITALS — BP 124/74 | HR 62 | Temp 96.8°F | Resp 20

## 2016-09-06 DIAGNOSIS — D509 Iron deficiency anemia, unspecified: Secondary | ICD-10-CM

## 2016-09-06 LAB — URINALYSIS, COMPLETE (UACMP) WITH MICROSCOPIC
BILIRUBIN URINE: NEGATIVE
GLUCOSE, UA: NEGATIVE mg/dL
KETONES UR: NEGATIVE mg/dL
NITRITE: NEGATIVE
PROTEIN: NEGATIVE mg/dL
Specific Gravity, Urine: 1.015 (ref 1.005–1.030)
pH: 6.5 (ref 5.0–8.0)

## 2016-09-06 LAB — HAPTOGLOBIN: HAPTOGLOBIN: 167 mg/dL (ref 34–200)

## 2016-09-06 MED ORDER — SODIUM CHLORIDE 0.9 % IV SOLN
Freq: Once | INTRAVENOUS | Status: AC
  Administered 2016-09-06: 14:00:00 via INTRAVENOUS
  Filled 2016-09-06: qty 1000

## 2016-09-06 MED ORDER — SODIUM CHLORIDE 0.9 % IV SOLN
510.0000 mg | Freq: Once | INTRAVENOUS | Status: AC
  Administered 2016-09-06: 510 mg via INTRAVENOUS
  Filled 2016-09-06: qty 17

## 2016-09-06 NOTE — Patient Instructions (Signed)

## 2016-09-07 LAB — MULTIPLE MYELOMA PANEL, SERUM
ALBUMIN SERPL ELPH-MCNC: 3.8 g/dL (ref 2.9–4.4)
Albumin/Glob SerPl: 1.2 (ref 0.7–1.7)
Alpha 1: 0.3 g/dL (ref 0.0–0.4)
Alpha2 Glob SerPl Elph-Mcnc: 0.7 g/dL (ref 0.4–1.0)
B-Globulin SerPl Elph-Mcnc: 1.3 g/dL (ref 0.7–1.3)
GAMMA GLOB SERPL ELPH-MCNC: 0.9 g/dL (ref 0.4–1.8)
Globulin, Total: 3.3 g/dL (ref 2.2–3.9)
IGA: 192 mg/dL (ref 64–422)
IGM, SERUM: 58 mg/dL (ref 26–217)
IgG (Immunoglobin G), Serum: 793 mg/dL (ref 700–1600)
Total Protein ELP: 7.1 g/dL (ref 6.0–8.5)

## 2016-09-07 LAB — H. PYLORI ANTIGEN, STOOL: H. PYLORI STOOL AG, EIA: NEGATIVE

## 2016-09-08 LAB — CELIAC DISEASE PANEL
ENDOMYSIAL ANTIBODY IGA: NEGATIVE
IgA: 191 mg/dL (ref 64–422)

## 2016-09-13 ENCOUNTER — Ambulatory Visit
Admission: RE | Admit: 2016-09-13 | Discharge: 2016-09-13 | Disposition: A | Discharge status: Home / Self Care | Payer: Medicare Other | Source: Ambulatory Visit | Attending: Internal Medicine | Admitting: Internal Medicine

## 2016-09-13 ENCOUNTER — Ambulatory Visit: Payer: Medicare Other

## 2016-09-13 ENCOUNTER — Inpatient Hospital Stay: Payer: Medicare Other

## 2016-09-13 VITALS — BP 114/71 | HR 60 | Temp 96.5°F | Resp 20

## 2016-09-13 DIAGNOSIS — D509 Iron deficiency anemia, unspecified: Secondary | ICD-10-CM | POA: Diagnosis not present

## 2016-09-13 DIAGNOSIS — R928 Other abnormal and inconclusive findings on diagnostic imaging of breast: Secondary | ICD-10-CM | POA: Insufficient documentation

## 2016-09-13 DIAGNOSIS — Z1231 Encounter for screening mammogram for malignant neoplasm of breast: Secondary | ICD-10-CM | POA: Diagnosis present

## 2016-09-13 HISTORY — DX: Malignant (primary) neoplasm, unspecified: C80.1

## 2016-09-13 MED ORDER — SODIUM CHLORIDE 0.9 % IV SOLN
Freq: Once | INTRAVENOUS | Status: AC
  Administered 2016-09-13: 10:00:00 via INTRAVENOUS
  Filled 2016-09-13: qty 1000

## 2016-09-13 MED ORDER — SODIUM CHLORIDE 0.9 % IV SOLN
510.0000 mg | Freq: Once | INTRAVENOUS | Status: AC
  Administered 2016-09-13: 510 mg via INTRAVENOUS
  Filled 2016-09-13: qty 17

## 2016-09-13 NOTE — Patient Instructions (Signed)

## 2016-09-21 ENCOUNTER — Other Ambulatory Visit: Payer: Self-pay | Admitting: Family Medicine

## 2016-09-22 ENCOUNTER — Other Ambulatory Visit: Payer: Self-pay | Admitting: Internal Medicine

## 2016-09-22 DIAGNOSIS — N6320 Unspecified lump in the left breast, unspecified quadrant: Secondary | ICD-10-CM

## 2016-09-22 DIAGNOSIS — N6310 Unspecified lump in the right breast, unspecified quadrant: Secondary | ICD-10-CM

## 2016-09-22 DIAGNOSIS — R928 Other abnormal and inconclusive findings on diagnostic imaging of breast: Secondary | ICD-10-CM

## 2016-10-04 ENCOUNTER — Ambulatory Visit
Admission: RE | Admit: 2016-10-04 | Discharge: 2016-10-04 | Disposition: A | Discharge status: Home / Self Care | Payer: Medicare Other | Source: Ambulatory Visit | Attending: Internal Medicine | Admitting: Internal Medicine

## 2016-10-04 DIAGNOSIS — N6320 Unspecified lump in the left breast, unspecified quadrant: Secondary | ICD-10-CM

## 2016-10-04 DIAGNOSIS — N6310 Unspecified lump in the right breast, unspecified quadrant: Secondary | ICD-10-CM

## 2016-10-04 DIAGNOSIS — R928 Other abnormal and inconclusive findings on diagnostic imaging of breast: Secondary | ICD-10-CM

## 2016-10-04 DIAGNOSIS — N6002 Solitary cyst of left breast: Secondary | ICD-10-CM | POA: Diagnosis not present

## 2016-10-04 DIAGNOSIS — N6001 Solitary cyst of right breast: Secondary | ICD-10-CM | POA: Diagnosis not present

## 2016-10-31 ENCOUNTER — Ambulatory Visit: Payer: Medicare Other | Admitting: Oncology

## 2016-10-31 ENCOUNTER — Ambulatory Visit: Payer: Medicare Other

## 2016-10-31 ENCOUNTER — Other Ambulatory Visit: Payer: Medicare Other

## 2016-11-02 ENCOUNTER — Other Ambulatory Visit: Payer: Medicare Other

## 2016-11-03 ENCOUNTER — Other Ambulatory Visit: Payer: Self-pay | Admitting: *Deleted

## 2016-11-03 DIAGNOSIS — D508 Other iron deficiency anemias: Secondary | ICD-10-CM

## 2016-11-07 ENCOUNTER — Encounter: Payer: Self-pay | Admitting: Oncology

## 2016-11-07 ENCOUNTER — Inpatient Hospital Stay: Payer: Medicare Other | Attending: Oncology | Admitting: Oncology

## 2016-11-07 ENCOUNTER — Inpatient Hospital Stay: Payer: Medicare Other

## 2016-11-07 VITALS — BP 105/68 | HR 60 | Temp 96.8°F | Resp 18

## 2016-11-07 VITALS — BP 142/70 | HR 62 | Temp 97.2°F | Resp 18 | Wt 179.8 lb

## 2016-11-07 DIAGNOSIS — D509 Iron deficiency anemia, unspecified: Secondary | ICD-10-CM | POA: Diagnosis not present

## 2016-11-07 DIAGNOSIS — E7800 Pure hypercholesterolemia, unspecified: Secondary | ICD-10-CM | POA: Diagnosis not present

## 2016-11-07 DIAGNOSIS — I129 Hypertensive chronic kidney disease with stage 1 through stage 4 chronic kidney disease, or unspecified chronic kidney disease: Secondary | ICD-10-CM | POA: Insufficient documentation

## 2016-11-07 DIAGNOSIS — N189 Chronic kidney disease, unspecified: Secondary | ICD-10-CM | POA: Insufficient documentation

## 2016-11-07 DIAGNOSIS — Z86010 Personal history of colonic polyps: Secondary | ICD-10-CM | POA: Diagnosis not present

## 2016-11-07 DIAGNOSIS — Z79899 Other long term (current) drug therapy: Secondary | ICD-10-CM

## 2016-11-07 DIAGNOSIS — Z96653 Presence of artificial knee joint, bilateral: Secondary | ICD-10-CM | POA: Insufficient documentation

## 2016-11-07 DIAGNOSIS — K5730 Diverticulosis of large intestine without perforation or abscess without bleeding: Secondary | ICD-10-CM | POA: Insufficient documentation

## 2016-11-07 DIAGNOSIS — D508 Other iron deficiency anemias: Secondary | ICD-10-CM

## 2016-11-07 DIAGNOSIS — R319 Hematuria, unspecified: Secondary | ICD-10-CM

## 2016-11-07 DIAGNOSIS — I872 Venous insufficiency (chronic) (peripheral): Secondary | ICD-10-CM | POA: Diagnosis not present

## 2016-11-07 DIAGNOSIS — M109 Gout, unspecified: Secondary | ICD-10-CM | POA: Insufficient documentation

## 2016-11-07 DIAGNOSIS — Z7982 Long term (current) use of aspirin: Secondary | ICD-10-CM | POA: Diagnosis not present

## 2016-11-07 LAB — URINALYSIS, COMPLETE (UACMP) WITH MICROSCOPIC
Bacteria, UA: NONE SEEN
Bilirubin Urine: NEGATIVE
Glucose, UA: NEGATIVE mg/dL
Hgb urine dipstick: NEGATIVE
Ketones, ur: NEGATIVE mg/dL
Leukocytes, UA: NEGATIVE
Nitrite: NEGATIVE
PH: 7.5 (ref 5.0–8.0)
Protein, ur: NEGATIVE mg/dL
RBC / HPF: NONE SEEN RBC/hpf (ref 0–5)
SPECIFIC GRAVITY, URINE: 1.015 (ref 1.005–1.030)

## 2016-11-07 LAB — CBC WITH DIFFERENTIAL/PLATELET
BASOS ABS: 0.1 10*3/uL (ref 0–0.1)
BASOS PCT: 1 %
Eosinophils Absolute: 0.4 10*3/uL (ref 0–0.7)
Eosinophils Relative: 5 %
HEMATOCRIT: 32.1 % — AB (ref 35.0–47.0)
Hemoglobin: 10.5 g/dL — ABNORMAL LOW (ref 12.0–16.0)
Lymphocytes Relative: 25 %
Lymphs Abs: 1.9 10*3/uL (ref 1.0–3.6)
MCH: 27.8 pg (ref 26.0–34.0)
MCHC: 32.6 g/dL (ref 32.0–36.0)
MCV: 85.3 fL (ref 80.0–100.0)
MONO ABS: 0.7 10*3/uL (ref 0.2–0.9)
Monocytes Relative: 9 %
NEUTROS ABS: 4.7 10*3/uL (ref 1.4–6.5)
Neutrophils Relative %: 60 %
PLATELETS: 274 10*3/uL (ref 150–440)
RBC: 3.77 MIL/uL — AB (ref 3.80–5.20)
RDW: 19.5 % — AB (ref 11.5–14.5)
WBC: 7.9 10*3/uL (ref 3.6–11.0)

## 2016-11-07 LAB — IRON AND TIBC
IRON: 20 ug/dL — AB (ref 28–170)
SATURATION RATIOS: 5 % — AB (ref 10.4–31.8)
TIBC: 430 ug/dL (ref 250–450)
UIBC: 410 ug/dL

## 2016-11-07 LAB — FERRITIN: Ferritin: 72 ng/mL (ref 11–307)

## 2016-11-07 MED ORDER — FERUMOXYTOL INJECTION 510 MG/17 ML
510.0000 mg | Freq: Once | INTRAVENOUS | Status: AC
  Administered 2016-11-07: 510 mg via INTRAVENOUS
  Filled 2016-11-07: qty 17

## 2016-11-07 MED ORDER — SODIUM CHLORIDE 0.9 % IV SOLN
Freq: Once | INTRAVENOUS | Status: AC
  Administered 2016-11-07: 11:00:00 via INTRAVENOUS
  Filled 2016-11-07: qty 1000

## 2016-11-07 NOTE — Progress Notes (Signed)
Hematology/Oncology Consult note [facility]  [phone] [phone]  Patient Care Team: Idelle Crouch, MD as PCP - General (Internal Medicine)   Name of the patient: Susan Brooks  [id number]  December 12, 1934   Date of visit: 11/07/16  Diagnosis- iron deficiency anemia  Chief complaint/ Reason for visit- routine f/u  Heme/Onc history: patient is a 82 year old female who has been referred to [place] for evaluation and management of iron deficiency anemia. CBC from 08/19/2016 showed white count of 7.7, H&H of 8.3/26.4 with an MCV of 83.5 and platelet count of 328. ESR was elevated at 79. CMP was significant for elevated BUN of 26 and creatinine of 1.3. LFTs were within normal limits. Iron studies from 07/04/2016 showed a low ferritin of 19, I'll elevated TIBC of 569 and low iron saturation of 5%  Patient follows up with [facility] and recently had a colonoscopy in January 2018 which showed: 05/2016--Colonoscopy-5 mm polyp sigmoid, 9 mm polyp descending, 2 flat polyps ascending, 2 mm polyp splenic flexure, 2 sessile polyps hepatic flexure, 12 mm polyp proximal sigmoid, 4 mm polyp rectosigmoid. Diverticulosis in sigmoid, descending, transverse. Benign appearing intrinsic stenosis 3 cm at hepatic flexure, tattooed, unable to advance pass intrinsic stenosis at hepatic flexure to see acsending colon. Polyp pathology with multiple tubular adenomas  Colonoscope could not pass was beyond the hepatic flexure and she underwent virtual colonoscopy which she tells me showed small polyps. Patient denies any routine use of NSAIDs. She has not had any EGD or capsule study before. She has been on oral iron for 2 weeks now and is tolerating it well. Denies any black tarry stools or blood in her stools. No family history of colon cancer.  Further blood work from 09/05/2016 was as follows: CBC showed white count of 7.1, H&H of 8.2/26.1 within MCV of 79.3 and a  platelet count of 316. Ferritin was low at 15. Iron saturation was low at 2% and serum iron was low at 13%. B12 folate and TSH and haptoglobin was within normal limits. Multiple myeloma panel revealed no monoclonal protein. Stool H. pylori antigen was negative. Celiac disease panel was normal. The urinalysis did show moderate hematuria  Interval history- reports fatigue significantly improved after receiving 2 doses of IV iron   Review of systems- Review of Systems  Constitutional: Negative for chills, fever, malaise/fatigue and weight loss.  HENT: Negative for congestion, ear discharge and nosebleeds.   Eyes: Negative for blurred vision.  Respiratory: Negative for cough, hemoptysis, sputum production, shortness of breath and wheezing.   Cardiovascular: Negative for chest pain, palpitations, orthopnea and claudication.  Gastrointestinal: Negative for abdominal pain, blood in stool, constipation, diarrhea, heartburn, melena, nausea and vomiting.  Genitourinary: Negative for dysuria, flank pain, frequency, hematuria and urgency.  Musculoskeletal: Negative for back pain, joint pain and myalgias.  Skin: Negative for rash.  Neurological: Negative for dizziness, tingling, focal weakness, seizures, weakness and headaches.  Endo/Heme/Allergies: Does not bruise/bleed easily.  Psychiatric/Behavioral: Negative for depression and suicidal ideas. The patient does not have insomnia.        Allergies  Allergen Reactions  . Oxycodone Nausea Only  . Prednisone Other (See Comments)    hallucinations  . Sulfa Antibiotics Nausea Only     Past Medical History:  Diagnosis Date  . Anemia   . Arthritis   . Cancer ([place])    skin ca  . Cervical polyp   . Chronic kidney disease   . Chronic venous  insufficiency   . Family history of adverse reaction to anesthesia    dad hallucinations  . Gout   . Headache   . Hypercholesteremia   . Hypertension   . PMB (postmenopausal bleeding)   . Vertigo 2016      Past Surgical History:  Procedure Laterality Date  . APPENDECTOMY    . bilateral hip replacments and bilateral knee replacements Bilateral   . BREAST BIOPSY Left 06/02/2010   [place] bx/clip-neg  . CHOLECYSTECTOMY    . COLONOSCOPY WITH PROPOFOL N/A 06/06/2016   Procedure: COLONOSCOPY WITH PROPOFOL;  Surgeon: Lollie Sails, MD;  Location: [facility] ENDOSCOPY;  Service: Endoscopy;  Laterality: N/A;  . DILATION AND CURETTAGE OF UTERUS    . HYSTEROSCOPY W/D&C N/A 11/13/2015   Procedure: DILATATION AND CURETTAGE /HYSTEROSCOPY;  Surgeon: Lorette Ang, MD;  Location: [facility] ORS;  Service: Gynecology;  Laterality: N/A;  . JOINT REPLACEMENT Bilateral    hips and knees  . MOUTH SURGERY    . TUBAL LIGATION      Social History   Social History  . Marital status: Widowed    Spouse name: N/A  . Number of children: N/A  . Years of education: N/A   Occupational History  . Not on file.   Social History Main Topics  . Smoking status: Never Smoker  . Smokeless tobacco: Never Used  . Alcohol use Yes  . Drug use: No  . Sexual activity: Not on file   Other Topics Concern  . Not on file   Social History Narrative  . No narrative on file    Family History  Problem Relation Age of Onset  . Breast cancer Neg Hx      Current Outpatient Prescriptions:  .  acetaminophen (TYLENOL) 500 MG tablet, Take 1,000 mg by mouth every 6 (six) hours as needed., Disp: , Rfl:  .  allopurinol (ZYLOPRIM) 300 MG tablet, Take 300 mg by mouth every morning., Disp: , Rfl:  .  amoxicillin (AMOXIL) 500 MG capsule, Take 2,000 mg by mouth as needed (prior to dental work). Reported on 11/13/2015, Disp: , Rfl:  .  aspirin 81 MG tablet, Take 81 mg by mouth every morning., Disp: , Rfl:  .  calcium citrate-vitamin D (CITRACAL+D) 315-200 MG-UNIT tablet, Take 1 tablet by mouth every morning., Disp: , Rfl:  .  cetirizine (ZYRTEC) 10 MG chewable tablet, Chew 10 mg by mouth daily., Disp: , Rfl:  .  fenofibrate (TRICOR) 48  MG tablet, Take 48 mg by mouth at bedtime., Disp: , Rfl:  .  losartan-hydrochlorothiazide (HYZAAR) 100-12.5 MG tablet, Take 1 tablet by mouth daily. In am., Disp: , Rfl:  .  meclizine (ANTIVERT) 25 MG tablet, Take 25 mg by mouth as needed for dizziness (vertigo)., Disp: , Rfl:  .  Multiple Vitamins-Minerals (MULTIVITAMIN WITH MINERALS) tablet, Take 1 tablet by mouth every morning., Disp: , Rfl:  .  Omega 3-6-9 Fatty Acids (OMEGA-3 & OMEGA-6 FISH OIL PO), Take 1 capsule by mouth every morning., Disp: , Rfl:  .  polyethylene glycol powder (GLYCOLAX/MIRALAX) powder, Take 17 g by mouth daily as needed., Disp: , Rfl:  .  propranolol ER (INDERAL LA) 120 MG 24 hr capsule, Take 120 mg by mouth 2 (two) times daily., Disp: , Rfl:  .  sennosides-docusate sodium (SENOKOT-S) 8.6-50 MG tablet, Take 1 tablet by mouth as needed for constipation., Disp: , Rfl:  .  vitamin E 400 UNIT capsule, Take 400 Units by mouth every morning., Disp: , Rfl:  Physical exam:  Vitals:   11/07/16 0952  BP: (!) 142/70  Pulse: 62  Resp: 18  Temp: 97.2 F (36.2 C)  TempSrc: Tympanic  Weight: 179 lb 12.6 oz (81.5 kg)   Physical Exam  Constitutional: She is oriented to person, place, and time and well-developed, well-nourished, and in no distress.  HENT:  Head: Normocephalic and atraumatic.  Eyes: EOM are normal. Pupils are equal, round, and reactive to light.  Neck: Normal range of motion.  Cardiovascular: Normal rate, regular rhythm and normal heart sounds.   Pulmonary/Chest: Effort normal and breath sounds normal.  Abdominal: Soft. Bowel sounds are normal.  Neurological: She is alert and oriented to person, place, and time.  Skin: Skin is warm and dry.     CMP Latest Ref Rng & Units 11/04/2015  Glucose 65 - 99 mg/dL 105(H)  BUN 6 - 20 mg/dL 25(H)  Creatinine 0.44 - 1.00 mg/dL 1.22(H)  Sodium 135 - 145 mmol/L 140  Potassium 3.5 - 5.1 mmol/L 3.7  Chloride 101 - 111 mmol/L 105  CO2 22 - 32 mmol/L 26  Calcium 8.9 -  10.3 mg/dL 10.2  Total Protein 6.5 - 8.1 g/dL 7.3  Total Bilirubin 0.3 - 1.2 mg/dL 0.6  Alkaline Phos 38 - 126 U/L 68  AST 15 - 41 U/L 59(H)  ALT 14 - 54 U/L 51   CBC Latest Ref Rng & Units 11/07/2016  WBC 3.6 - 11.0 K/uL 7.9  Hemoglobin 12.0 - 16.0 g/dL 10.5(L)  Hematocrit 35.0 - 47.0 % 32.1(L)  Platelets 150 - 440 K/uL 274     Assessment and plan- Patient is a 82 y.o. female with iron deficiency anemia likely secondary to GI bleeding  I discussed the results of the blood work with the patient is about. Her urinalysis did show some blood and I will repeat her urinalysis today. If it is positive I will refer her to urology. Patient has received 2 doses of ferriheme so far. Last dose on 09/13/2016. Hb improved to 10.5 today. I will give her 2 more doses of feraheme. Repeat cbc and iron studies in 2 months and 4 months. Will see her in 4 months. She will see GI next month to discuss EGD. Iron studies from today pending   Visit Diagnosis 1. Iron deficiency anemia, unspecified iron deficiency anemia type      Dr. Randa Evens, MD, MPH [facility] Pager- [phone] 11/07/2016 12:22 PM

## 2016-11-07 NOTE — Patient Instructions (Signed)

## 2016-11-07 NOTE — Patient Instructions (Signed)
Keep your scheduled appointment with Dr. Gustavo Lah on  12/21/2016 Office Visit Gastroenterology Carolynn Serve, MD  [place]  [facility] [place], [place] [address]  [phone]  [phone] (Fax)

## 2016-11-07 NOTE — Progress Notes (Signed)
Here for follow up. Stated doing well  

## 2016-11-14 ENCOUNTER — Inpatient Hospital Stay: Payer: Medicare Other

## 2016-11-14 ENCOUNTER — Ambulatory Visit: Payer: Medicare Other

## 2016-11-14 VITALS — BP 98/66 | HR 64 | Temp 96.2°F | Resp 18

## 2016-11-14 DIAGNOSIS — D509 Iron deficiency anemia, unspecified: Secondary | ICD-10-CM

## 2016-11-14 MED ORDER — SODIUM CHLORIDE 0.9 % IV SOLN
Freq: Once | INTRAVENOUS | Status: AC
  Administered 2016-11-14: 09:00:00 via INTRAVENOUS
  Filled 2016-11-14: qty 1000

## 2016-11-14 MED ORDER — SODIUM CHLORIDE 0.9 % IV SOLN
510.0000 mg | Freq: Once | INTRAVENOUS | Status: AC
  Administered 2016-11-14: 510 mg via INTRAVENOUS
  Filled 2016-11-14: qty 17

## 2016-11-21 ENCOUNTER — Other Ambulatory Visit: Payer: Medicare Other

## 2017-01-02 ENCOUNTER — Telehealth: Payer: Self-pay | Admitting: *Deleted

## 2017-01-02 ENCOUNTER — Other Ambulatory Visit: Payer: Self-pay | Admitting: *Deleted

## 2017-01-02 ENCOUNTER — Inpatient Hospital Stay: Payer: Medicare Other | Attending: Oncology

## 2017-01-02 ENCOUNTER — Inpatient Hospital Stay: Payer: Medicare Other

## 2017-01-02 DIAGNOSIS — Z79899 Other long term (current) drug therapy: Secondary | ICD-10-CM | POA: Diagnosis not present

## 2017-01-02 DIAGNOSIS — D509 Iron deficiency anemia, unspecified: Secondary | ICD-10-CM | POA: Insufficient documentation

## 2017-01-02 LAB — CBC
HEMATOCRIT: 31.5 % — AB (ref 35.0–47.0)
Hemoglobin: 10.3 g/dL — ABNORMAL LOW (ref 12.0–16.0)
MCH: 29.1 pg (ref 26.0–34.0)
MCHC: 32.8 g/dL (ref 32.0–36.0)
MCV: 88.7 fL (ref 80.0–100.0)
Platelets: 295 10*3/uL (ref 150–440)
RBC: 3.55 MIL/uL — ABNORMAL LOW (ref 3.80–5.20)
RDW: 16.9 % — AB (ref 11.5–14.5)
WBC: 8.4 10*3/uL (ref 3.6–11.0)

## 2017-01-02 LAB — IRON AND TIBC
IRON: 31 ug/dL (ref 28–170)
Saturation Ratios: 9 % — ABNORMAL LOW (ref 10.4–31.8)
TIBC: 357 ug/dL (ref 250–450)
UIBC: 326 ug/dL

## 2017-01-02 LAB — FERRITIN: Ferritin: 228 ng/mL (ref 11–307)

## 2017-01-02 NOTE — Telephone Encounter (Signed)
Called pt's mobile and left message that her ferritin was 228 and she did not need to get anymore iv iron at this time.  She already has appt 10/16 9 am for labs and 1:30 to see md. If she has questions she can call me and left my direct number. I also left same message on her home phone

## 2017-03-06 ENCOUNTER — Other Ambulatory Visit: Payer: Medicare Other

## 2017-03-13 ENCOUNTER — Other Ambulatory Visit: Payer: Self-pay | Admitting: *Deleted

## 2017-03-13 ENCOUNTER — Ambulatory Visit: Payer: Medicare Other

## 2017-03-13 ENCOUNTER — Ambulatory Visit: Payer: Medicare Other | Admitting: Oncology

## 2017-03-13 DIAGNOSIS — D649 Anemia, unspecified: Secondary | ICD-10-CM

## 2017-03-14 ENCOUNTER — Inpatient Hospital Stay: Payer: Medicare Other

## 2017-03-14 ENCOUNTER — Inpatient Hospital Stay: Payer: Medicare Other | Admitting: Oncology

## 2017-03-15 ENCOUNTER — Telehealth: Payer: Self-pay | Admitting: *Deleted

## 2017-03-15 NOTE — Telephone Encounter (Signed)
-----   Message from Festus Holts sent at 03/14/2017  3:02 PM EDT ----- Regarding: Cancelled Tx FYI  Patient was a no show for today's appt. Called patient to reschedule appointments and was advised that she is having Gastric issues and will give [place] a call to reschedule when she's feeling better.   Verdis Frederickson

## 2017-03-15 NOTE — Telephone Encounter (Signed)
Susan Brooks called pt about her missing appt and see note that pt will call back when she is feeling better.

## 2018-05-08 IMAGING — RF DG BE LIMITED  W/ CM
2 series · 10 of 10 positions shown · non-contrast
Comparison: None in PACs.

CLINICAL DATA: Incomplete colonoscopy 3 weeks ago. Screening study.

EXAM:
SINGLE COLUMN BARIUM ENEMA
TECHNIQUE: Initial scout AP supine abdominal image obtained to insure adequate
colon cleansing. There was a moderate amount of retained air within
the right colon. Barium was introduced into the colon in a
retrograde fashion. Over the course of the study multiple maneuvers
were attempted to get the barium to pass more proximally than the
mid transverse colon. This was unsuccessful. Spot images of the
colon followed by overhead radiographs were obtained.
FLUOROSCOPY TIME:  Fluoroscopy Time:  5 minutes, 30 seconds
Radiation Exposure Index (if provided by the fluoroscopic device):
1916 micro Gy per meters squared
Number of Acquired Spot Images: 7+ multiple overhead images.

[Series 1: t abdomen supine · 0.14mm/px · 6 of 12 slices shown]
[im 1/12]
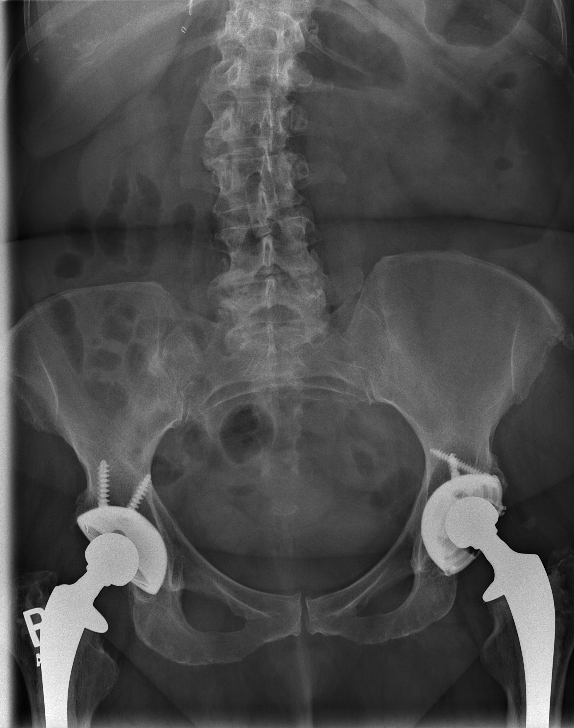
[im 3/12]
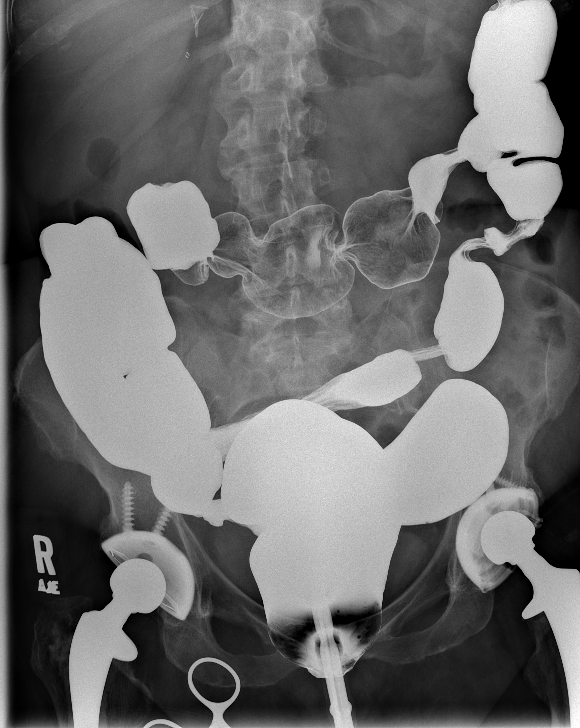
[im 5/12]
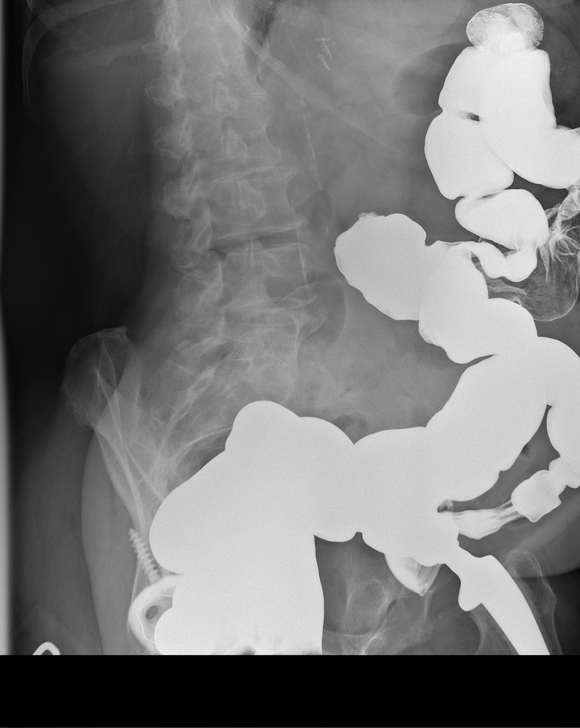
[im 7/12]
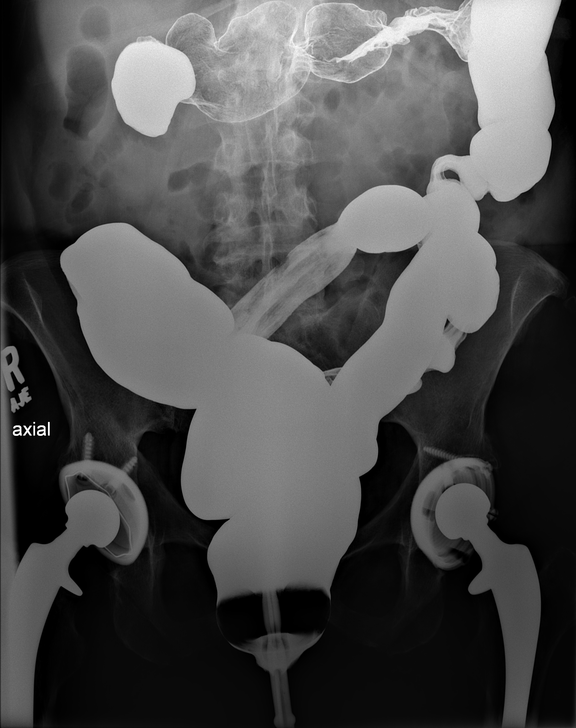
[im 9/12]
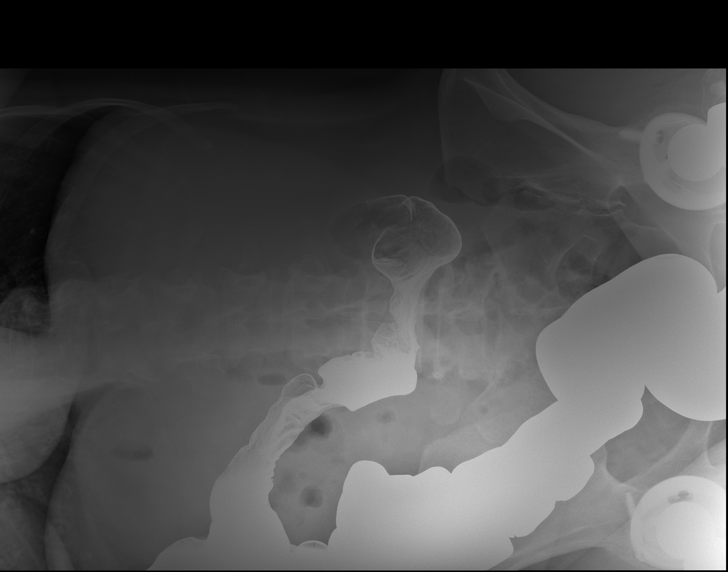
[im 12/12]
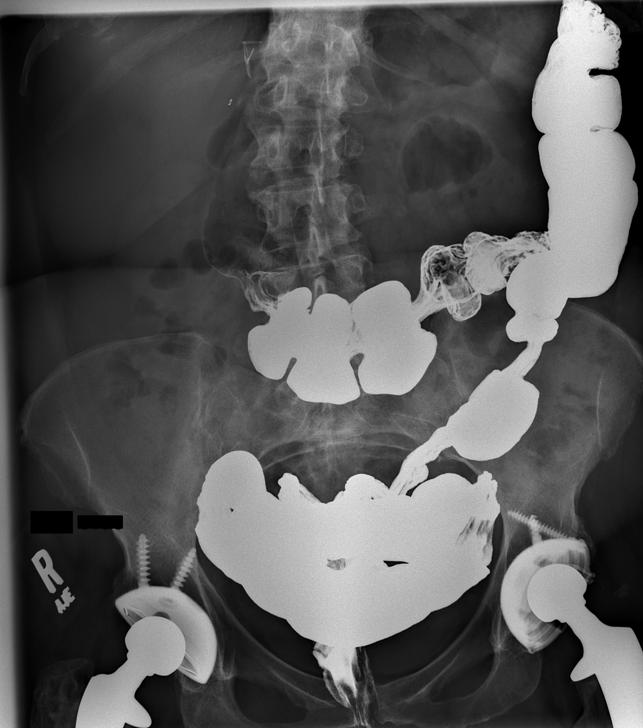

[Series 2: fluoro_barium singleshot_bb · 0.19mm/px · 4 of 7 slices shown]
[im 1/7]
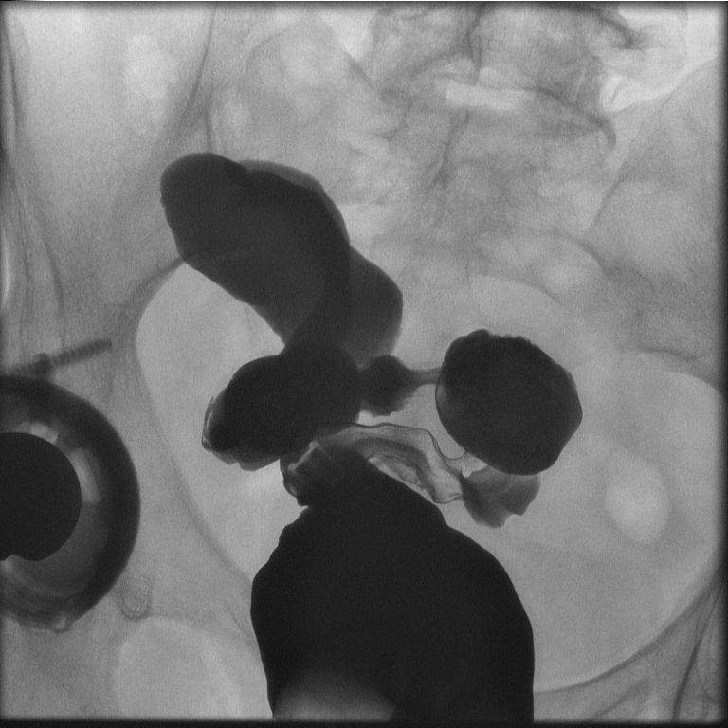
[im 3/7]
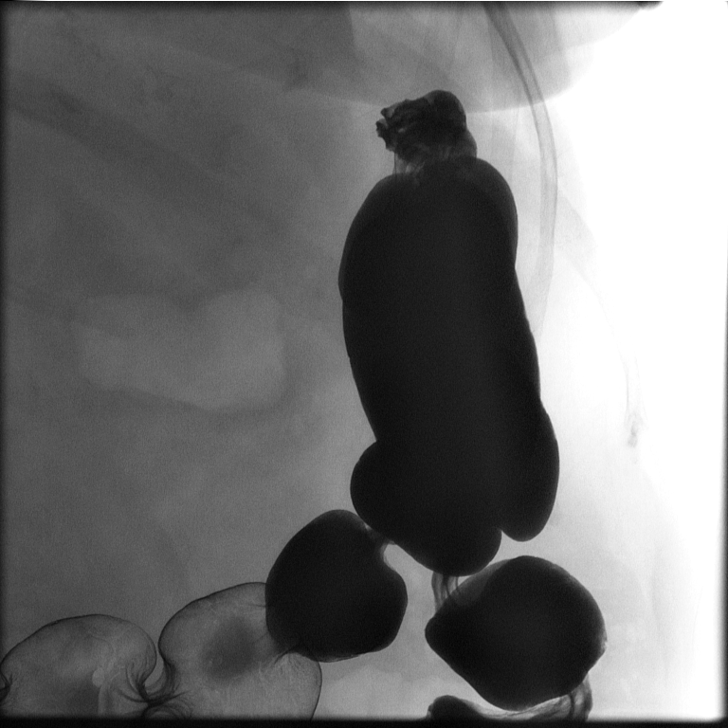
[im 5/7]
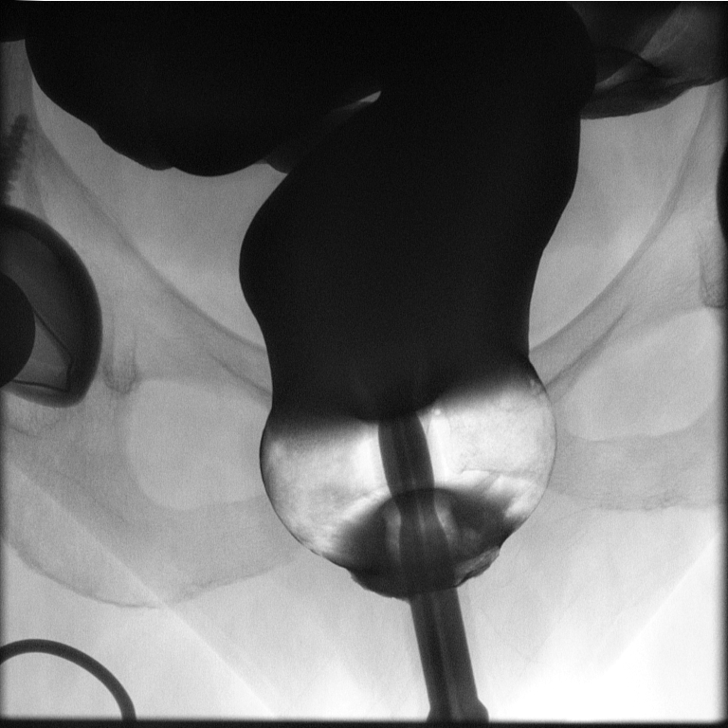
[im 7/7]
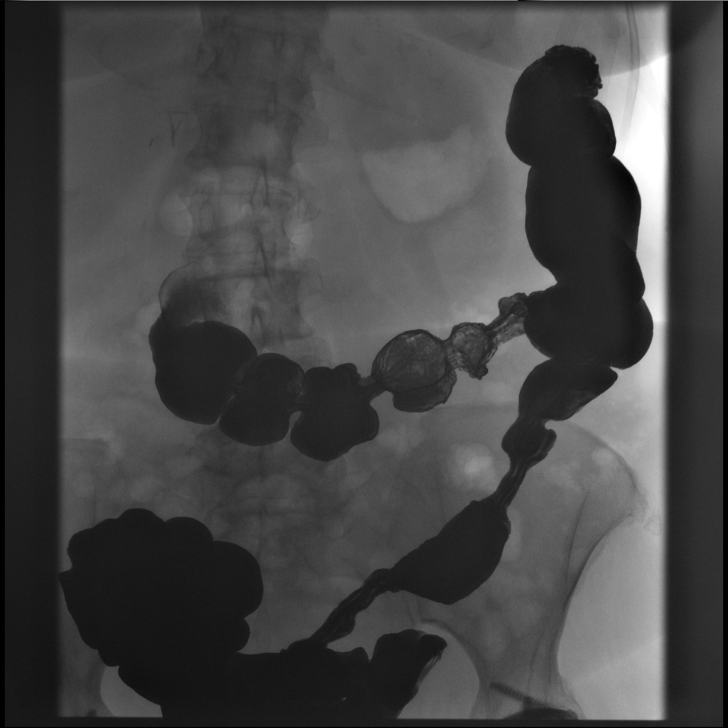

[10 of 10 positions shown; findings below may reference images not displayed]

FINDINGS: The scout film revealed a moderate amount of barium within the
transverse and right colon. The barium catheter balloon was inflated
under fluoroscopic guidance. Barium was then instilled in
anticipation of performing an air-contrast study. However the
rectosigmoid was quite tortuous and passage of barium more
proximally to the proximal transverse colon was ultimately obtained.
No filling of the ascending colon or cecum was possible despite
multiple attempts.

The observed portions of the transverse colon, descending colon, and
rectosigmoid reveal no annular constricting lesions. A moderate
amount of spasm was observed. No intraluminal filling defects were
observed.
IMPRESSION: Very tortuous colon as well as an air block phenomenon which
precluded performance of an air-contrast study. A limited single
contrast barium enema was performed with barium reaching as far
proximally as the proximal transverse colon. There is no
visualization of the cecum or ascending colon.

The limitations of this study were explained to the patient. The
possibility of performing a CT colonoscopy was discussed.

## 2019-03-22 IMAGING — US US BREAST*R* LIMITED INC AXILLA
1 series · 8 of 8 positions shown · non-contrast
Comparison: Prior studies including 09/14/2016

CLINICAL DATA: The patient returns after screening study for
evaluation of possible bilateral breast masses.

EXAM:
2D DIGITAL DIAGNOSTIC BILATERAL MAMMOGRAM WITH CAD AND ADJUNCT TOMO
ULTRASOUND BILATERAL BREAST

[Series 1: us breast*right* limited inc axilla · 0.06mm/px · 8 of 8 slices shown]
[im 1/8]
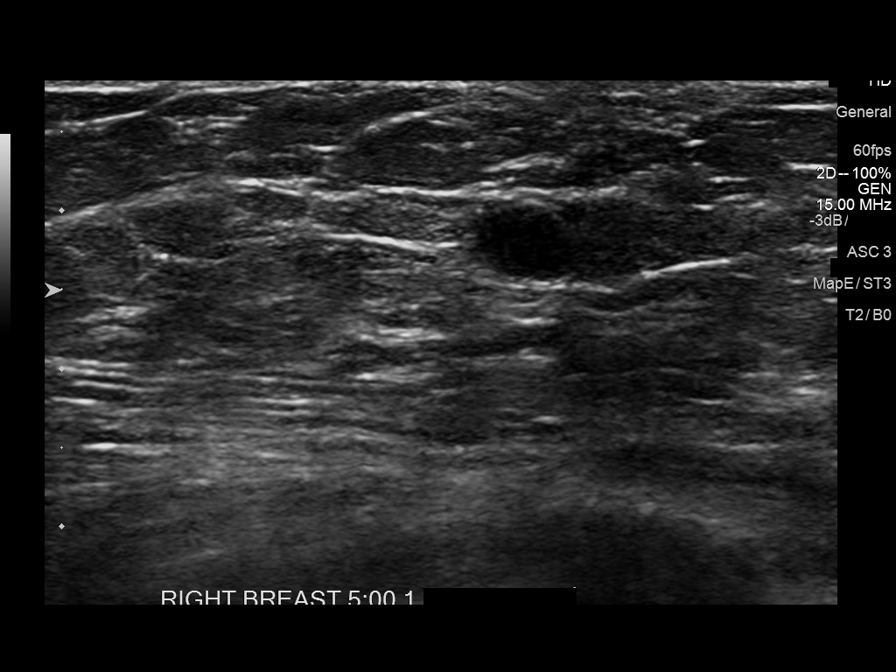
[im 2/8]
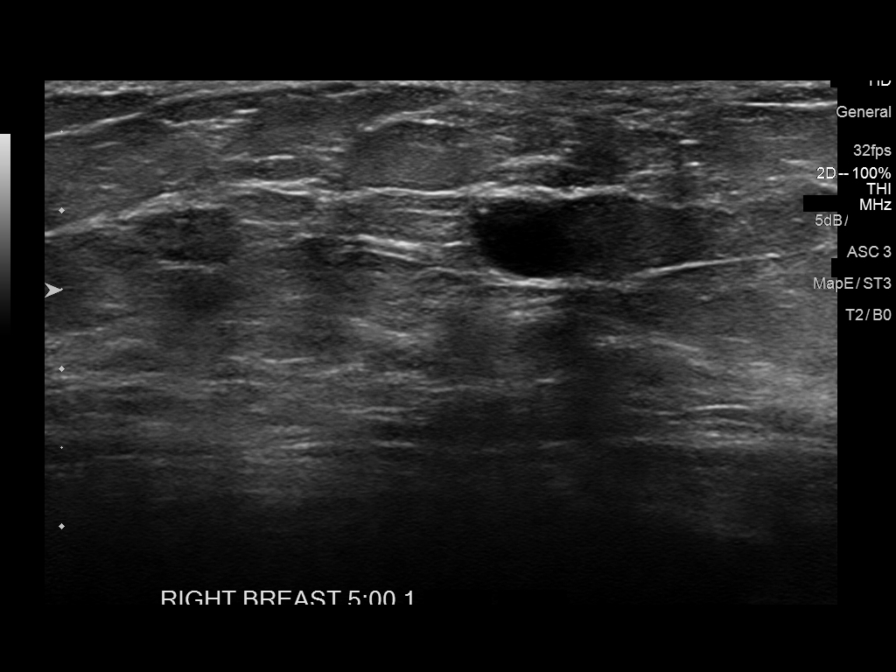
[im 3/8]
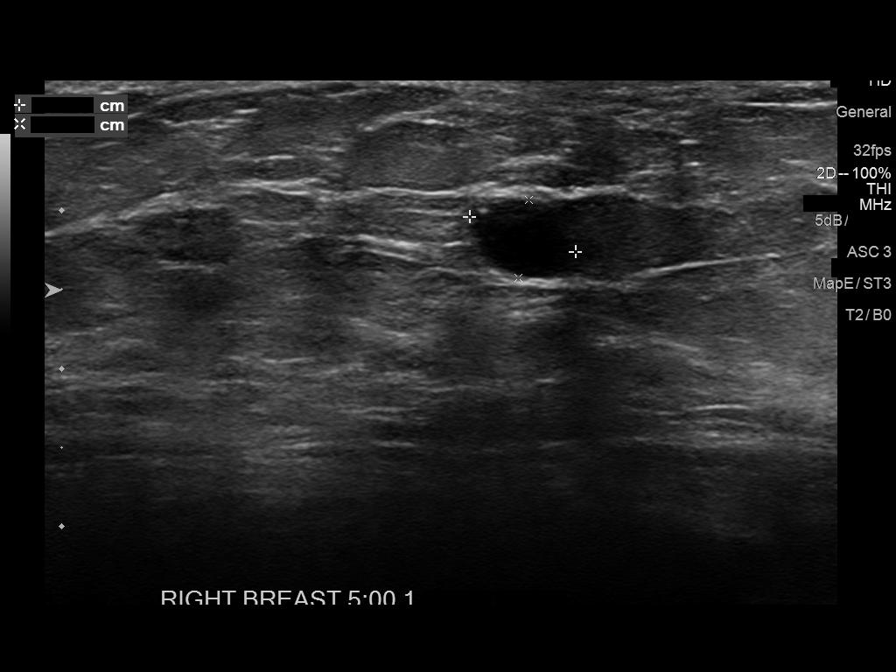
[im 4/8]
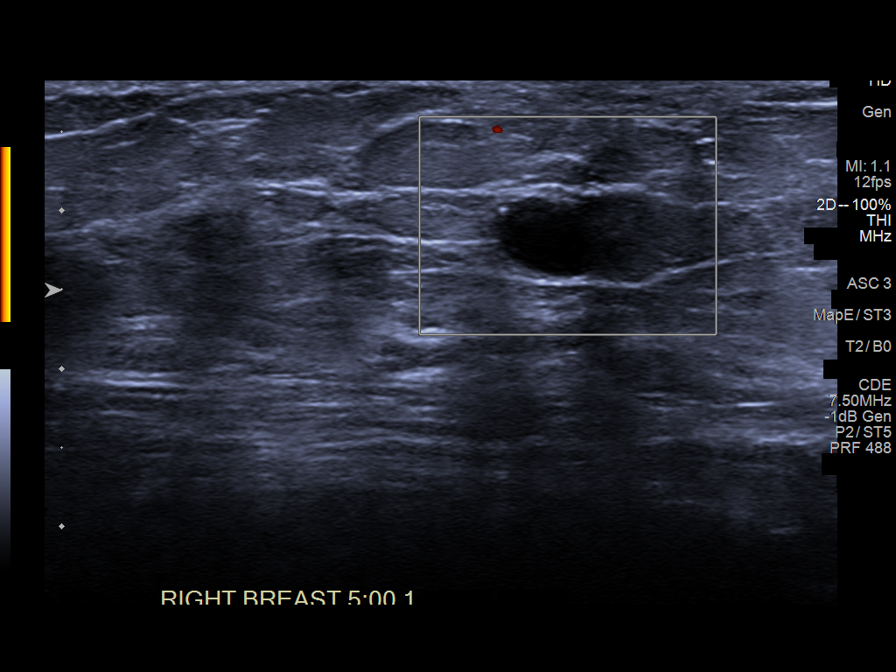
[im 5/8]
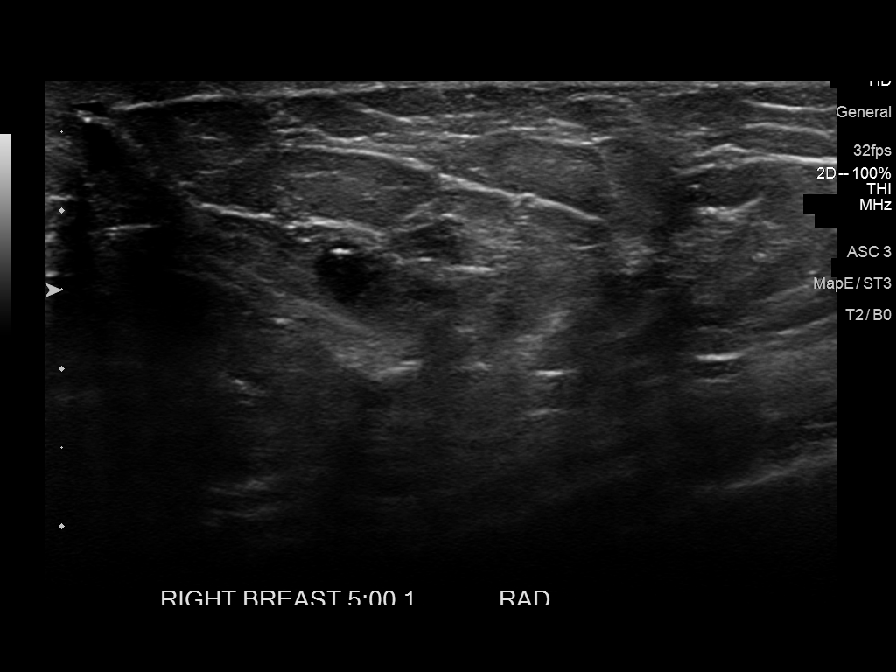
[im 6/8]
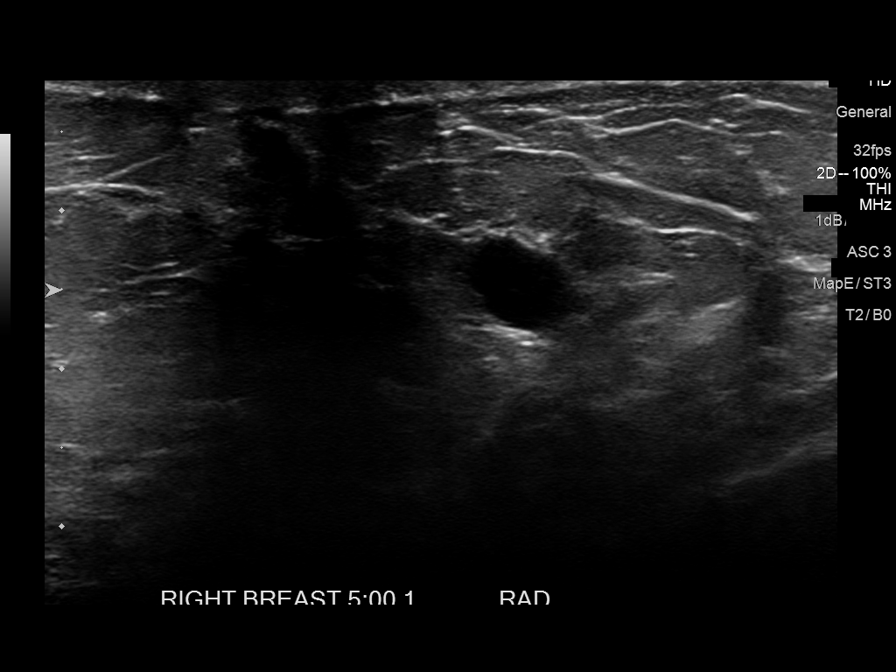
[im 7/8]
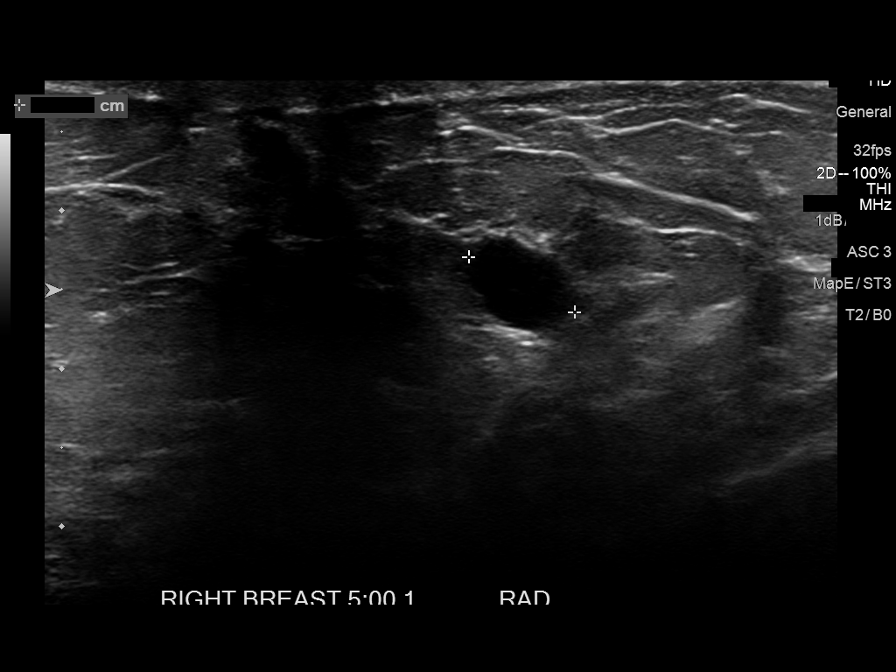
[im 8/8]
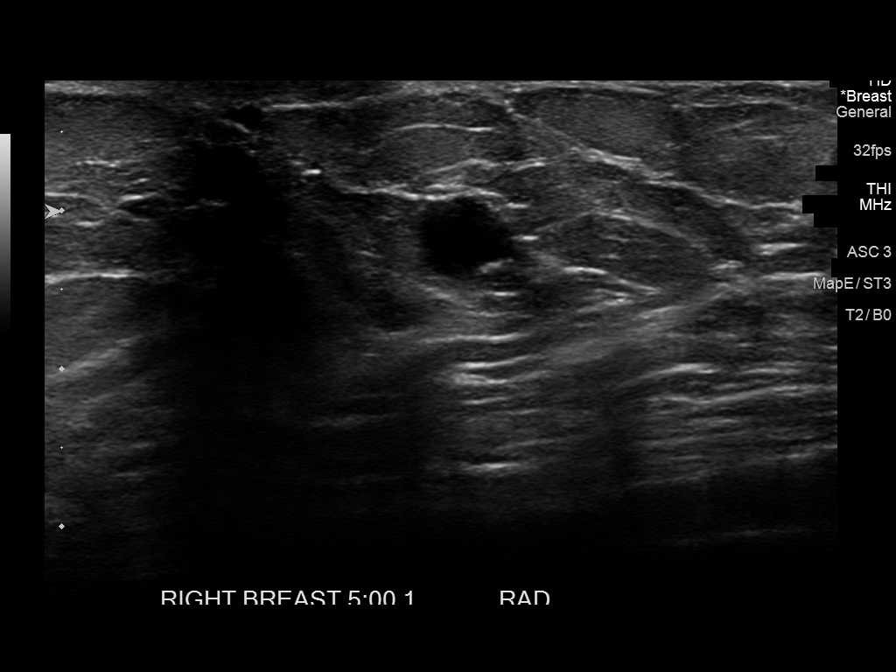

[8 of 8 positions shown; findings below may reference images not displayed]

ACR Breast Density Category b: There are scattered areas of
fibroglandular density.
FINDINGS: Additional 2-D and 3-D images are performed. Views confirm presence
of a circumscribed oval mass in the lower inner quadrant of the
right breast. Views also confirm presence of a circumscribed oval
mass in the lower central portion of the left breast.

Mammographic images were processed with CAD.

Targeted ultrasound is performed, showing a simple cyst in the 5
o'clock location of the right breast 1 cm from the nipple. Cyst is
0.8 x 0.7 x 0.5 cm. No solid component or area of acoustic shadowing
identified.

Targeted ultrasound is also performed of the lower portion of the
left breast, demonstrating a simple cyst in the 5:30 o'clock
location 2 cm from nipple which measures 0.7 x 0.5 x 0.4 cm. No
solid component or area of acoustic shadowing identified.
IMPRESSION: 1. Persistent abnormalities are bilateral simple cysts.
2.  No mammographic or ultrasound evidence for malignancy.

RECOMMENDATION:
Screening mammogram in one year.(Code:[id number]-[id number])

I have discussed the findings and recommendations with the patient.
Results were also provided in writing at the conclusion of the
visit. If applicable, a reminder letter will be sent to the patient
regarding the next appointment.

BI-RADS CATEGORY  2: Benign.

## 2019-10-29 DEATH — deceased
